# Patient Record
Sex: Male | Born: 1965
Health system: Southern US, Community
[De-identification: ages and names within clinical notes are randomized; demographics above are authoritative.]

## PROBLEM LIST (undated history)

## (undated) ENCOUNTER — Emergency Department (HOSPITAL_BASED_OUTPATIENT_CLINIC_OR_DEPARTMENT_OTHER): Payer: 59

## (undated) DIAGNOSIS — D649 Anemia, unspecified: Secondary | ICD-10-CM

## (undated) DIAGNOSIS — R112 Nausea with vomiting, unspecified: Secondary | ICD-10-CM

## (undated) DIAGNOSIS — Z87442 Personal history of urinary calculi: Secondary | ICD-10-CM

## (undated) DIAGNOSIS — M1990 Unspecified osteoarthritis, unspecified site: Secondary | ICD-10-CM

## (undated) DIAGNOSIS — M129 Arthropathy, unspecified: Secondary | ICD-10-CM

## (undated) DIAGNOSIS — Z9889 Other specified postprocedural states: Secondary | ICD-10-CM

## (undated) DIAGNOSIS — K759 Inflammatory liver disease, unspecified: Secondary | ICD-10-CM

## (undated) HISTORY — DX: Arthropathy, unspecified: M12.9

## (undated) HISTORY — PX: JOINT REPLACEMENT: SHX530

---
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
## 1990-10-11 HISTORY — PX: SHOULDER ARTHROSCOPY W/ ROTATOR CUFF REPAIR: SHX2400

## 2005-07-14 ENCOUNTER — Emergency Department (HOSPITAL_COMMUNITY): Admission: EM | Admit: 2005-07-14 | Discharge: 2005-07-14 | Payer: Self-pay | Admitting: Emergency Medicine

## 2008-06-07 ENCOUNTER — Ambulatory Visit: Payer: Self-pay | Admitting: Sports Medicine

## 2008-06-07 DIAGNOSIS — M179 Osteoarthritis of knee, unspecified: Secondary | ICD-10-CM | POA: Insufficient documentation

## 2008-06-07 DIAGNOSIS — M1711 Unilateral primary osteoarthritis, right knee: Secondary | ICD-10-CM | POA: Insufficient documentation

## 2008-06-07 DIAGNOSIS — M1611 Unilateral primary osteoarthritis, right hip: Secondary | ICD-10-CM

## 2008-06-07 DIAGNOSIS — M129 Arthropathy, unspecified: Secondary | ICD-10-CM

## 2008-06-10 ENCOUNTER — Encounter: Payer: Self-pay | Admitting: Sports Medicine

## 2008-10-16 ENCOUNTER — Ambulatory Visit: Payer: Self-pay | Admitting: Diagnostic Radiology

## 2008-10-16 ENCOUNTER — Emergency Department (HOSPITAL_BASED_OUTPATIENT_CLINIC_OR_DEPARTMENT_OTHER): Admission: EM | Admit: 2008-10-16 | Discharge: 2008-10-16 | Payer: Self-pay | Admitting: Emergency Medicine

## 2009-02-20 ENCOUNTER — Encounter: Admission: RE | Admit: 2009-02-20 | Discharge: 2009-02-20 | Payer: Self-pay | Admitting: Family Medicine

## 2009-07-12 ENCOUNTER — Ambulatory Visit: Payer: Self-pay | Admitting: Diagnostic Radiology

## 2009-07-12 ENCOUNTER — Emergency Department (HOSPITAL_BASED_OUTPATIENT_CLINIC_OR_DEPARTMENT_OTHER): Admission: EM | Admit: 2009-07-12 | Discharge: 2009-07-12 | Payer: Self-pay | Admitting: Emergency Medicine

## 2009-11-07 ENCOUNTER — Encounter: Admission: RE | Admit: 2009-11-07 | Discharge: 2009-11-07 | Payer: Self-pay | Admitting: Orthopedic Surgery

## 2010-04-09 ENCOUNTER — Ambulatory Visit: Payer: Self-pay | Admitting: Sports Medicine

## 2010-04-09 DIAGNOSIS — M76899 Other specified enthesopathies of unspecified lower limb, excluding foot: Secondary | ICD-10-CM

## 2010-04-14 ENCOUNTER — Encounter: Payer: Self-pay | Admitting: Family Medicine

## 2010-04-29 ENCOUNTER — Encounter: Admission: RE | Admit: 2010-04-29 | Discharge: 2010-06-08 | Payer: Self-pay | Admitting: Family Medicine

## 2010-04-29 ENCOUNTER — Encounter: Payer: Self-pay | Admitting: Sports Medicine

## 2010-06-11 ENCOUNTER — Ambulatory Visit: Payer: Self-pay | Admitting: Sports Medicine

## 2010-07-13 ENCOUNTER — Emergency Department (HOSPITAL_COMMUNITY): Admission: EM | Admit: 2010-07-13 | Discharge: 2010-07-13 | Payer: Self-pay | Admitting: Family Medicine

## 2010-07-13 ENCOUNTER — Telehealth (INDEPENDENT_AMBULATORY_CARE_PROVIDER_SITE_OTHER): Payer: Self-pay | Admitting: *Deleted

## 2010-11-10 NOTE — Letter (Signed)
Summary: *Referral Letter  [facility]  [address]   [place], [place] [address]   Phone: [phone]  Fax: [phone]    06/11/2010 Dr. Gean Birchwood [facility]  Dear Homero Fellers:   Thank you in advance for agreeing to see my patient whom you have seen in past for knee arthritis.  [facility] [address] [place], [place]  [address]  Phone: [phone]  Reason for Referral: I think he needs RT hip replacement and at some point RT knee. Your opinion.  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  Sincerely,  Vincent Gros MD

## 2010-11-10 NOTE — Assessment & Plan Note (Signed)
Summary: F/U HIP,MC   Vital Signs:  Patient profile:   45 year old male BP sitting:   114 / 78  Vitals Entered By: Enid Baas MD (June 11, 2010 11:40 AM)  History of Present Illness: 45 yo WM with hx severe right knee and hip arthritis and hx of ACL injury, now here for f/u of right hip pain. PT has helped ROM but not pain. He has tried Mobic and Etodolac in apst with little benefit. He likes to play basketball when he has time. He still has pain voer the greater trochaneter and glut mm.  injection on last visit did help for 1 mo  Allergies: No Known Drug Allergies  Review of Systems General:  Denies chills, fatigue, fever, loss of appetite, malaise, sleep disorder, sweats, weakness, and weight loss. MS:  Complains of joint pain and stiffness; denies joint redness, joint swelling, loss of strength, low back pain, mid back pain, muscle aches, muscle , cramps, muscle weakness, and thoracic pain. Neuro:  Denies numbness and tingling.  Physical Exam  General:  Well-developed,well-nourished,in no acute distress; alert,appropriate and cooperative throughout examination Msk:  Hip: ROM RT Hip IR: 0 Deg, ER: 30 Deg, Flexion: 120 Deg, Extension: 100 Deg, Abduction: 45 Deg, Adduction: 45 Deg Strength IR: 5/5, ER: 5/5, Flexion: 5/5, Extension: 5/5, Abduction: 5/5, Adduction: 5/5 Pelvic alignment unremarkable to inspection and palpation. Standing hip rotation and gait without trendelenburg / unsteadiness. Greater trochanter with (+) tenderness to palpation. No tenderness over piriformis or left greater trochanter. No SI joint tenderness and normal minimal SI movement. (+) trendelenberg with ambulation  Neurologic:  No cranial nerve deficits noted. Station and gait are normal. Plantar reflexes are down-going bilaterally. DTRs are symmetrical throughout. Sensory, motor and coordinative functions appear intact.   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 1-2 tabs q 6 hours as needed Prescriptions: TRAMADOL HCL 50 MG  TABS (TRAMADOL HCL) 1-2 tabs q 6 hours as needed  #120 x 2   Entered and Authorized by:   Enid Baas MD   Signed by:   Enid Baas MD on 06/11/2010   Method used:   Electronically to        [facility] [phone]* (retail)       [address]       [place], [place]  [address]       Ph: [phone]       Fax: [phone]   RxID:   [id number]

## 2010-11-10 NOTE — Miscellaneous (Signed)
Summary: [facility]  [facility]   Imported By: Marily Memos 04/30/2010 13:44:18  _____________________________________________________________________  External Attachment:    Type:   Image     Comment:   External Document

## 2010-11-10 NOTE — Letter (Signed)
Summary: [facility] PT referral form  [facility] PT referral form   Imported By: Marily Memos 04/21/2010 11:41:58  _____________________________________________________________________  External Attachment:    Type:   Image     Comment:   External Document

## 2010-11-10 NOTE — Letter (Signed)
Summary: [facility] PT referral form  [facility] PT referral form   Imported By: Marily Memos 04/21/2010 11:19:03  _____________________________________________________________________  External Attachment:    Type:   Image     Comment:   External Document

## 2010-11-10 NOTE — Assessment & Plan Note (Signed)
Summary: HIP PAIN,MC   Vital Signs:  Patient profile:   45 year old male Height:      70 inches Weight:      195 pounds BMI:     28.08 BP sitting:   121 / 84  Vitals Entered By: Lillia Pauls CMA (April 09, 2010 1:37 PM)  History of Present Illness: Pt presents for right lateral hip pain that has been getting progressively worse for the past year. He has a known history of moderate to severe arthritis of his right hip. He has noticed that he has less motion in his right hip in addition to severe lateral pain that is worse with going up and down stairs as well as with lying on his right hip at night. This pain will wake him up from sleep. He also has pain in his right buttocks in right groin region that is worse with bending forward to put his shoes and socks on. He can no longer cross his right leg over his left leg. The pain is now constant and became worse when he tried to ride a bicycle with his son last week. He could not pedal at all with his right hip because of its stiffness and because of pain. He takes aspirin but this does not relieve the lateral right hip pain. He has tried etodolac which used to help but is no longer as helpful. He is limping more often these days. His mother has a history of OA in her hands. He takes Tramadol as needed.   Allergies (verified): No Known Drug Allergies  Past History:  Social History: Last updated: 04/09/2010 Likes to play basketball Married  Past Medical History: Arthritis of his right hip and knee  Social History: Likes to play basketball Married  Physical Exam  General:  alert and well-developed.   Head:  normocephalic and atraumatic.   Eyes:  vision grossly intact.   Ears:  Normal hearing Mouth:  MMM Neck:  supple and full ROM.   Lungs:  normal respiratory effort.   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 Patient understood that this is likley due to his severe right hip arthritis and that this may return until his hip pain improves 2. Consented for an injection into his trochanteric bursa on the right Consent obtained and verified. Sterile betadine prep. Furthur cleansed with alcohol. Topical analgesic spray: Ethyl chloride. Joint: Right Trochanteric Bursa Approached in typical fashion with: Completed without difficulty Meds: 40mg  of Kenalog, 9 cc of 1% Lidocaine Needle: Spinal Aftercare instructions and Red flags advised. No heavy lifting for 3 days.   3. Will refer for physical therapy to see if he can improve his range of motion or at least hip strength 4. Follow-up in 4-5 weeks  Orders: Joint Aspirate / Injection, Large (20610) Kenalog 10mg  (4units) (M5784) Physical Therapy Referral (PT)  Problem # 2:  ARTHRITIS, RIGHT HIP (ICD-716.95) Assessment: Deteriorated  1. Could consider and intra-articular injection into his hip for  pain reliev 2. Will likely need a hip replacement in the near future since his quality of life has deteriorated recently  Orders: Physical Therapy Referral (PT)  Problem # 3:  ARTHRITIS, KNEES, BILATERAL (ICD-716.98) Assessment: Improved Much improved per the patient's report  Complete Medication List: 1)  Tramadol Hcl 50 Mg Tabs (Tramadol hcl) .Marland Kitchen.. 1-2 tabs q 6 hours as needed

## 2010-11-10 NOTE — Progress Notes (Signed)
  Phone Note Call from Patient   Caller: Patient Call For: Dr. Darrick Penna Summary of Call: Recvd call from pt's wife- she states that patient took tramadol last night at 1030pm and this morning was having shortness of breath, nausea and chest soreness.  Only symptom that persists at this time is nausea. Dr Darrick Penna then spoke with patient on the phone, and states that he had experienced similar symptoms but not as severe over the past week after taking the med on 2 occassions.  Dr. Darrick Penna advised patient to stop the tramadol, and try ibuprofen, aleve, of tylenol for symptom relief. Patient has not heard from Dr. Wadie Lessen office about referral.  Called Dr. Wadie Lessen office and spoke with Victorino Dike- she states they recvd the referral but had not been able to reach patient to make appt. Gave her the number that patient called from this morning.  Asked her to call back if she is still unable to reach patient.  Also left patient a VM asking him to call back if he does not hear from their office.  Initial call taken by: Rochele Pages RN,  July 13, 2010 11:17 AM

## 2011-01-25 LAB — BASIC METABOLIC PANEL
BUN: 28 mg/dL — ABNORMAL HIGH (ref 6–23)
CO2: 30 mEq/L (ref 19–32)
Calcium: 9.5 mg/dL (ref 8.4–10.5)
Chloride: 102 mEq/L (ref 96–112)
Creatinine, Ser: 1.2 mg/dL (ref 0.4–1.5)
GFR calc Af Amer: >60 mL/min (ref >60)
GFR calc non Af Amer: >60 mL/min (ref >60)
Glucose, Bld: 144 mg/dL — ABNORMAL HIGH (ref 70–99)
Potassium: 4 mEq/L (ref 3.5–5.1)
Sodium: 141 mEq/L (ref 135–145)

## 2011-01-25 LAB — CBC
HCT: 41 % (ref 39.0–52.0)
Hemoglobin: 14 g/dL (ref 13.0–17.0)
MCHC: 34.2 g/dL (ref 30.0–36.0)
MCV: 87.2 fL (ref 78.0–100.0)
Platelets: 160 10*3/uL (ref 150–400)
RBC: 4.7 MIL/uL (ref 4.22–5.81)
RDW: 12.9 % (ref 11.5–15.5)
WBC: 8.2 10*3/uL (ref 4.0–10.5)

## 2011-01-25 LAB — URINALYSIS, ROUTINE W REFLEX MICROSCOPIC
Glucose, UA: NEGATIVE mg/dL
Ketones, ur: 15 mg/dL — AB
Leukocytes, UA: NEGATIVE
Nitrite: NEGATIVE
Protein, ur: 30 mg/dL — AB
Specific Gravity, Urine: 1.03 (ref 1.005–1.030)
Urobilinogen, UA: 1 mg/dL (ref 0.0–1.0)
pH: 5 (ref 5.0–8.0)

## 2011-01-25 LAB — URINE MICROSCOPIC-ADD ON

## 2011-01-25 LAB — DIFFERENTIAL
Basophils Absolute: 0 10*3/uL (ref 0.0–0.1)
Basophils Relative: 0 % (ref 0–1)
Eosinophils Absolute: 0 10*3/uL (ref 0.0–0.7)
Eosinophils Relative: 1 % (ref 0–5)
Lymphocytes Relative: 8 % — ABNORMAL LOW (ref 12–46)
Lymphs Abs: 0.6 10*3/uL — ABNORMAL LOW (ref 0.7–4.0)
Monocytes Absolute: 0.4 10*3/uL (ref 0.1–1.0)
Monocytes Relative: 5 % (ref 3–12)
Neutro Abs: 7.2 10*3/uL (ref 1.7–7.7)
Neutrophils Relative %: 86 % — ABNORMAL HIGH (ref 43–77)

## 2011-09-10 ENCOUNTER — Ambulatory Visit (INDEPENDENT_AMBULATORY_CARE_PROVIDER_SITE_OTHER): Payer: 59 | Admitting: Family Medicine

## 2011-09-10 ENCOUNTER — Encounter: Payer: Self-pay | Admitting: Family Medicine

## 2011-09-10 VITALS — BP 107/74 | HR 72 | Temp 98.1°F | Ht 70.0 in | Wt 205.0 lb

## 2011-09-10 DIAGNOSIS — M25551 Pain in right hip: Secondary | ICD-10-CM

## 2011-09-10 DIAGNOSIS — M76899 Other specified enthesopathies of unspecified lower limb, excluding foot: Secondary | ICD-10-CM

## 2011-09-10 DIAGNOSIS — M25559 Pain in unspecified hip: Secondary | ICD-10-CM

## 2011-09-10 MED ORDER — DICLOFENAC SODIUM 50 MG PO TBEC: 50.0000 mg | DELAYED_RELEASE_TABLET | Freq: Two times a day (BID) | ORAL | Status: DC

## 2011-09-10 NOTE — Patient Instructions (Signed)
You have trochanteric bursitis. Ice the area 3-4 times a day 15 minutes at a time Try diclofenac twice a day with food for pain and inflammation for next 2 weeks - if this doesn't help you more than the nabumetone, stop it and go back to that medicine. You were given a cortisone injection for bursitis. Avoid painful activities when possible Avoid lying on affected side if this is painful. Continue home stretches and exercises most days of the week that you learned in physical therapy. Follow up with [place] as needed.

## 2011-09-12 ENCOUNTER — Encounter: Payer: Self-pay | Admitting: Family Medicine

## 2011-09-12 NOTE — Assessment & Plan Note (Addendum)
While he does have severe OA confirmed by limited ROM of hip, most of his current pain is consistent with trochanteric bursitis.  Went ahead with a bursal injection today which he tolerated well.  Continue with home exercises and stretches he learned in PT.  Mild lumbar radiculopathy could be another consideration if he's not improving and pain is not localized to his groin - he did not get benefit with an intraarticular injection a year ago.  Will try diclofenac instead of nabumetone.  See instructions for further.  After informed written consent (risks bleeding, infection, sciatic nerve injury), patient was lying on left side on exam table.  Area overlying right trochanteric bursa was prepped with alcohol swab.  Then bursa was injected with 6:2 marcaine:depomedrol.  He tolerated the procedure well without any immediate complications.

## 2011-09-12 NOTE — Progress Notes (Signed)
Subjective:    Patient ID: Bruce Hart, male    DOB: March 18, 1966, 45 y.o.   MRN: [id number]  PCP: Dr. Sandi Mealy  HPI 45 yo M here for right hip pain.  Patient has had > 3 years of right hip pain. Has known mod-severe right hip arthritis as well as trochanteric bursitis. Last seen about a year ago for these issues. Takes nabumetone which he reports does help. Has had trochanteric bursa injections on right side which help for several months and he would like one again today. Had an intraarticular injection of right hip with helped maybe briefly but didn't last more than the day he had the injection and was very painful. No prior hip injuries and no new injuries. Occasional numbness/tingling into his knees. Bruise sensation of his buttock in addition to pain on lateral aspect of hip. Mild groin pain but majority is lateral. Able to sleep on this side.  Past Medical History  Diagnosis Date  . Arthritis, multiple joint involvement     No current outpatient prescriptions on file prior to visit.    Past Surgical History  Procedure Date  . Shoulder surgery     1992 left shoulder scope    No Known Allergies  History   Social History  . Marital Status: Married    Spouse Name: N/A    Number of Children: N/A  . Years of Education: N/A   Occupational History  . Not on file.   Social History Main Topics  . Smoking status: Never Smoker   . Smokeless tobacco: Not on file  . Alcohol Use: Not on file  . Drug Use: Not on file  . Sexually Active: Not on file   Other Topics Concern  . Not on file   Social History Narrative  . No narrative on file    Family History  Problem Relation Age of Onset  . Hyperlipidemia Mother   . Hypertension Mother   . Sudden death Father   . Diabetes Father   . Hyperlipidemia Father   . Hypertension Father     BP 107/74  Pulse 72  Temp(Src) 98.1 F (36.7 C) (Oral)  Ht 5\' 10"  (1.778 m)  Wt 205 lb (92.987 kg)  BMI 29.41 kg/m2  Review of  Systems See HPI    Objective:   Physical Exam Back: No gross deformity, scoliosis. Mod TTP right greater trochanter, mild just posterior to this .  No midline or bony TTP. FROM back without pain on flexion or extension. Strength LEs 5/5 all muscle groups including hip abduction. 2+ MSRs in patellar and achilles tendons, equal bilaterally. Negative SLRs. Sensation intact to light touch bilaterally. Negative fabers and piriformis stretches - difficult to adequately perform with limited hip ROM.  R hip: Minimal internal rotation consistent with severe OA. Mild pain on logroll and IR but mostly lateral hip, not in groin.     Assessment & Plan:  1. Right hip pain - While he does have severe OA confirmed by limited ROM of hip, most of his current pain is consistent with trochanteric bursitis.  Went ahead with a bursal injection today which he tolerated well.  Continue with home exercises and stretches he learned in PT.  Mild lumbar radiculopathy could be another consideration if he's not improving and pain is not localized to his groin - he did not get benefit with an intraarticular injection a year ago.  Will try diclofenac instead of nabumetone.  See instructions for further.  After informed written  consent (risks bleeding, infection, sciatic nerve injury), patient was lying on left side on exam table.  Area overlying right trochanteric bursa was prepped with alcohol swab.  Then bursa was injected with 6:2 marcaine:depomedrol.  He tolerated the procedure well without any immediate complications.

## 2012-07-10 ENCOUNTER — Other Ambulatory Visit: Payer: Self-pay | Admitting: Family Medicine

## 2012-08-09 ENCOUNTER — Ambulatory Visit: Payer: 59 | Admitting: Family Medicine

## 2012-08-15 ENCOUNTER — Ambulatory Visit (HOSPITAL_BASED_OUTPATIENT_CLINIC_OR_DEPARTMENT_OTHER)
Admission: RE | Admit: 2012-08-15 | Discharge: 2012-08-15 | Disposition: A | Discharge status: Home / Self Care | Payer: 59 | Source: Ambulatory Visit | Attending: Family Medicine | Admitting: Family Medicine

## 2012-08-15 ENCOUNTER — Ambulatory Visit (INDEPENDENT_AMBULATORY_CARE_PROVIDER_SITE_OTHER): Payer: 59 | Admitting: Family Medicine

## 2012-08-15 ENCOUNTER — Encounter: Payer: Self-pay | Admitting: Family Medicine

## 2012-08-15 VITALS — BP 121/75 | Ht 70.0 in | Wt 165.0 lb

## 2012-08-15 DIAGNOSIS — M25559 Pain in unspecified hip: Secondary | ICD-10-CM

## 2012-08-15 DIAGNOSIS — R222 Localized swelling, mass and lump, trunk: Secondary | ICD-10-CM

## 2012-08-15 DIAGNOSIS — M25551 Pain in right hip: Secondary | ICD-10-CM

## 2012-08-15 MED ORDER — DICLOFENAC SODIUM 50 MG PO TBEC: 50.0000 mg | DELAYED_RELEASE_TABLET | Freq: Two times a day (BID) | ORAL | Status: AC

## 2012-08-15 NOTE — Assessment & Plan Note (Signed)
Unusual in that he has not had prior injury, not noticed this before.  Malignancy would be unusual in this area.  Would expect this to be a subluxed sternocostal joint but again not noticed prior injury.  CXR performed today negative for abnormality but difficult to assess.  Will move forward with MRI without contrast to further assess.

## 2012-08-15 NOTE — Progress Notes (Addendum)
Subjective:    Patient ID: Bruce Hart, male    DOB: [birth year], 46 y.o.   MRN: [id number]  PCP: Dr. Sandi Mealy  Hip Pain    46 yo M here for f/u right hip pain.  09/10/11: Patient has had > 3 years of right hip pain. Has known mod-severe right hip arthritis as well as trochanteric bursitis. Last seen about a year ago for these issues. Takes nabumetone which he reports does help. Has had trochanteric bursa injections on right side which help for several months and he would like one again today. Had an intraarticular injection of right hip with helped maybe briefly but didn't last more than the day he had the injection and was very painful. No prior hip injuries and no new injuries. Occasional numbness/tingling into his knees. Bruise sensation of his buttock in addition to pain on lateral aspect of hip. Mild groin pain but majority is lateral. Able to sleep on this side.  08/15/12: Patient reports trochanteric injection last visit helped for several months. Doing home exercises/stretches when he can as well. Has been icing. Over past few weeks has become bad again - mostly lateral hip, sometimes in right groin where he has fairly severe arthritis (injection here previously did not help). Voltaren orally has helped. Rarely radiates from lateral hip. No numbness/tingling or bowel/bladder dysfunction.  He also noted (and wife has noticed) he's developed a mass left side of chest wall that he did not notice previously. Feels hard, not mobile. No injury to this area that he is aware of. May have come up in past few months. No pain associated with this. Did not report any sweats, chills, prior h/o cancer either.  Past Medical History  Diagnosis Date  . Arthritis, multiple joint involvement     No current outpatient prescriptions on file prior to visit.    Past Surgical History  Procedure Date  . Shoulder surgery     1992 left shoulder scope    No Known Allergies  History    Social History  . Marital Status: Married    Spouse Name: N/A    Number of Children: N/A  . Years of Education: N/A   Occupational History  . Not on file.   Social History Main Topics  . Smoking status: Never Smoker   . Smokeless tobacco: Not on file  . Alcohol Use: Not on file  . Drug Use: Not on file  . Sexually Active: Not on file   Other Topics Concern  . Not on file   Social History Narrative  . No narrative on file    Family History  Problem Relation Age of Onset  . Hyperlipidemia Mother   . Hypertension Mother   . Sudden death Father   . Diabetes Father   . Hyperlipidemia Father   . Hypertension Father     BP 121/75  Ht 5\' 10"  (1.778 m)  Wt 165 lb (74.844 kg)  BMI 23.68 kg/m2  Review of Systems  See HPI    Objective:   Physical Exam  Back: No gross deformity, scoliosis. Mod TTP right greater trochanter, mild just posterior to this reproducing his pain.  No midline or bony TTP. FROM back without pain on flexion or extension. Strength LEs 5/5 all muscle groups including hip abduction.  Mild pain with hip abduction. Negative SLRs. Sensation intact to light touch bilaterally.  R hip: Minimal internal rotation consistent with severe OA. No pain on logroll and IR.  Chest: Lower left sternocostal border  palpable bony protrusion at joint. Nonmobile mass here seems congruent with rib.   No tenderness to palpation.       Assessment & Plan:  1. Right hip pain - While he does have severe OA confirmed by limited ROM of hip, most of his current pain is consistent with trochanteric bursitis.  Prior bursal injection provided relief also consistent with this diagnosis.  Repeat trochanteric bursa injection given today.  Handout provided with stretches.  Consider formal PT (has done this already though).  Refilled his diclofenac twice a day as needed.  After informed written consent (risks bleeding, infection, sciatic nerve injury), patient was lying on left  side on exam table.  Area overlying right trochanteric bursa was prepped with alcohol swab.  Then bursa was injected with 6:2 marcaine:depomedrol.  He tolerated the procedure well without any immediate complications.  2. Chest mass - Unusual in that he has not had prior injury, not noticed this before.  Malignancy would be unusual in this area.  Would expect this to be a subluxed sternocostal joint but again not noticed prior injury.  CXR performed today negative for abnormality but difficult to assess.  Will move forward with MRI without contrast to further assess.  MRI results reviewed and discussed with patient - no evidence of mass, joint dislocation or subluxation in area of concern.  Reassured patient.

## 2012-08-15 NOTE — Assessment & Plan Note (Signed)
While he does have severe OA confirmed by limited ROM of hip, most of his current pain is consistent with trochanteric bursitis.  Prior bursal injection provided relief also consistent with this diagnosis.  Repeat trochanteric bursa injection given today.  Handout provided with stretches.  Consider formal PT (has done this already though).  Refilled his diclofenac twice a day as needed.  After informed written consent (risks bleeding, infection, sciatic nerve injury), patient was lying on left side on exam table.  Area overlying right trochanteric bursa was prepped with alcohol swab.  Then bursa was injected with 6:2 marcaine:depomedrol.  He tolerated the procedure well without any immediate complications.

## 2012-08-19 ENCOUNTER — Other Ambulatory Visit (HOSPITAL_BASED_OUTPATIENT_CLINIC_OR_DEPARTMENT_OTHER): Payer: 59

## 2012-08-19 ENCOUNTER — Ambulatory Visit (HOSPITAL_BASED_OUTPATIENT_CLINIC_OR_DEPARTMENT_OTHER)
Admission: RE | Admit: 2012-08-19 | Discharge: 2012-08-19 | Disposition: A | Discharge status: Home / Self Care | Payer: 59 | Source: Ambulatory Visit | Attending: Family Medicine | Admitting: Family Medicine

## 2012-08-19 DIAGNOSIS — R222 Localized swelling, mass and lump, trunk: Secondary | ICD-10-CM | POA: Insufficient documentation

## 2013-07-18 ENCOUNTER — Ambulatory Visit: Payer: 59 | Admitting: Family Medicine

## 2013-07-19 ENCOUNTER — Encounter: Payer: Self-pay | Admitting: Family Medicine

## 2013-07-19 ENCOUNTER — Ambulatory Visit (INDEPENDENT_AMBULATORY_CARE_PROVIDER_SITE_OTHER): Payer: 59 | Admitting: Family Medicine

## 2013-07-19 VITALS — BP 111/71 | HR 64 | Ht 70.0 in | Wt 171.0 lb

## 2013-07-19 DIAGNOSIS — S93499A Sprain of other ligament of unspecified ankle, initial encounter: Secondary | ICD-10-CM

## 2013-07-19 DIAGNOSIS — M25519 Pain in unspecified shoulder: Secondary | ICD-10-CM

## 2013-07-19 DIAGNOSIS — S86011A Strain of right Achilles tendon, initial encounter: Secondary | ICD-10-CM

## 2013-07-19 DIAGNOSIS — M25511 Pain in right shoulder: Secondary | ICD-10-CM

## 2013-07-19 NOTE — Patient Instructions (Signed)
You suffered a shoulder separation (AC sprain), likely grade 2. Ice the area 3-4 times a day for 15 minutes at a time as needed. Tylenol or aleve as needed for pain These usually resolve in 2-4 weeks.  No additional therapy needs to be done.  You strained your right achilles but did not rupture this. Ice 15 minutes at a time (ok to use heat instead after 72 hours). Heel lifts when up and walking around usually for 4-6 weeks. When tolerated (pain 1-2/10 with this) start calf raise exercise 3 sets of 10 on both feet. Advance to 1 footed raise when tolerated. Compression with a sleeve or ace wrap though may be difficult this low on the calf. Consider formal physical therapy. You have to be able to do the exercise without pain before returning to running and cutting. When you do, try jogging first for a couple days then advance to cutting as you would when playing basketball. Expect 1-2 weeks for you to complete this progression. Call me with any questions or concerns.

## 2013-07-23 ENCOUNTER — Encounter: Payer: Self-pay | Admitting: Family Medicine

## 2013-07-23 DIAGNOSIS — M6520 Calcific tendinitis, unspecified site: Secondary | ICD-10-CM | POA: Insufficient documentation

## 2013-07-23 DIAGNOSIS — S86011A Strain of right Achilles tendon, initial encounter: Secondary | ICD-10-CM | POA: Insufficient documentation

## 2013-07-23 NOTE — Progress Notes (Signed)
Patient ID: Bruce Hart, male   DOB: April 05, 1966, 47 y.o.   MRN: [id number]  PCP: Ancil Boozer, MD  Subjective:   HPI: Patient is a 47 y.o. male here for right leg, shoulder pain.  Patient reports on Tuesday night he went up for a rebound playing basketball, came down and felt some pain in right achilles area. No bruising or swelling. Stopped playing because he didn't want to hurt anything further. Has been icing, taking meloxicam. Pain worse with stairs.  Reports also 2 weeks ago falling directly onto right shoulder. Pain lateral clavicle, superior shoulder. Has improved since but still with some pain. Maybe some swelling initially but none now and no bruising.  Past Medical History  Diagnosis Date  . Arthritis, multiple joint involvement     Current Outpatient Prescriptions on File Prior to Visit  Medication Sig Dispense Refill  . diclofenac (VOLTAREN) 50 MG EC tablet Take 1 tablet (50 mg total) by mouth 2 (two) times daily.  60 tablet  2   No current facility-administered medications on file prior to visit.    Past Surgical History  Procedure Laterality Date  . Shoulder surgery      1992 left shoulder scope    No Known Allergies  History   Social History  . Marital Status: Married    Spouse Name: N/A    Number of Children: N/A  . Years of Education: N/A   Occupational History  . Not on file.   Social History Main Topics  . Smoking status: Never Smoker   . Smokeless tobacco: Not on file  . Alcohol Use: Not on file  . Drug Use: Not on file  . Sexual Activity: Not on file   Other Topics Concern  . Not on file   Social History Narrative  . No narrative on file    Family History  Problem Relation Age of Onset  . Hyperlipidemia Mother   . Hypertension Mother   . Sudden death Father   . Diabetes Father   . Hyperlipidemia Father   . Hypertension Father     BP 111/71  Pulse 64  Ht 5\' 10"  (1.778 m)  Wt 171 lb (77.565 kg)  BMI 24.54  kg/m2  Review of Systems: See HPI above.    Objective:  Physical Exam:  Gen: NAD  Right ankle/foot: No gross deformity, swelling, ecchymoses, defect in achilles. FROM ankle with pain on calf raise but able to do so. TTP at musculotendinous junction of achilles.  No other TTP. Negative ant drawer and talar tilt.   Negative syndesmotic compression. Thompsons test negative. NV intact distally.  R shoulder: No swelling, ecchymoses.  No gross deformity. TTP AC joint and distal clavicle. FROM. Negative Hawkins, Neers. Negative Speeds, Yergasons. Strength 5/5 with empty can and resisted internal/external rotation. Negative apprehension. + crossover adduction. NV intact distally.    Assessment & Plan:  1. Right shoulder AC sprain - likely grade 2.  Doubt clavicle fracture given extent to which he has improved over just 2 weeks, also no deformity.  Offered radiographs but he declined at this time.  Tylenol, aleve, icing.  2. Right achilles strain - thompsons negative, no defect within the tendon.  Able to do calf raise, all positive signs.  Heel lifts, icing, tylenol/nsaids.  When tolerated start calf raise exercise as directed.  Compression.  Consider formal PT.  Discussed return to sports protocol.

## 2013-07-23 NOTE — Assessment & Plan Note (Signed)
thompsons negative, no defect within the tendon.  Able to do calf raise, all positive signs.  Heel lifts, icing, tylenol/nsaids.  When tolerated start calf raise exercise as directed.  Compression.  Consider formal PT.  Discussed return to sports protocol.

## 2013-07-23 NOTE — Assessment & Plan Note (Signed)
Right shoulder AC sprain - likely grade 2.  Doubt clavicle fracture given extent to which he has improved over just 2 weeks, also no deformity.  Offered radiographs but he declined at this time.  Tylenol, aleve, icing.

## 2013-11-14 ENCOUNTER — Other Ambulatory Visit: Payer: Self-pay | Admitting: Family Medicine

## 2013-11-14 ENCOUNTER — Other Ambulatory Visit: Payer: Self-pay | Admitting: *Deleted

## 2013-11-14 MED ORDER — DICLOFENAC SODIUM 50 MG PO TBEC: 50.0000 mg | DELAYED_RELEASE_TABLET | Freq: Two times a day (BID) | ORAL | Status: DC

## 2014-04-19 ENCOUNTER — Other Ambulatory Visit: Payer: Self-pay | Admitting: Family Medicine

## 2014-04-19 DIAGNOSIS — D179 Benign lipomatous neoplasm, unspecified: Secondary | ICD-10-CM

## 2014-04-29 ENCOUNTER — Ambulatory Visit
Admission: RE | Admit: 2014-04-29 | Discharge: 2014-04-29 | Disposition: A | Discharge status: Home / Self Care | Payer: 59 | Source: Ambulatory Visit | Attending: Family Medicine | Admitting: Family Medicine

## 2014-04-29 DIAGNOSIS — D179 Benign lipomatous neoplasm, unspecified: Secondary | ICD-10-CM

## 2014-11-25 ENCOUNTER — Encounter: Payer: Self-pay | Admitting: Family Medicine

## 2014-11-25 ENCOUNTER — Ambulatory Visit: Payer: Self-pay | Admitting: Sports Medicine

## 2014-11-25 ENCOUNTER — Ambulatory Visit (INDEPENDENT_AMBULATORY_CARE_PROVIDER_SITE_OTHER): Payer: 59 | Admitting: Family Medicine

## 2014-11-25 VITALS — BP 115/76 | HR 73 | Ht 70.0 in | Wt 194.0 lb

## 2014-11-25 DIAGNOSIS — M25551 Pain in right hip: Secondary | ICD-10-CM

## 2014-11-25 DIAGNOSIS — M76891 Other specified enthesopathies of right lower limb, excluding foot: Secondary | ICD-10-CM

## 2014-11-25 MED ORDER — METHYLPREDNISOLONE ACETATE 40 MG/ML IJ SUSP
40.0000 mg | Freq: Once | INTRAMUSCULAR | Status: AC
  Administered 2014-11-25: 40 mg via INTRA_ARTICULAR

## 2014-11-25 NOTE — Patient Instructions (Signed)
You have trochanteric bursitis. Avoid painful activities as much as possible. Ice over area of pain 3-4 times a day for 15 minutes at a time Hip side raise exercise 3 x 15 once a day - add weights if this becomes too easy. Stretches - pick 2 and hold for 20-30 seconds x 3 - do once or twice a day. Tylenol and/or aleve as needed for pain. You were given a cortisone shot today. If not improving consider physical therapy. Follow up with me in 1 month or as needed.

## 2014-11-27 NOTE — Progress Notes (Signed)
PCP: Patria Mane, MD  Subjective:   HPI: Patient is a 49 y.o. male here for right hip pain.  Patient reports pain has worsened over past several months. Seemed to improve but then worsened again. No known injury. Worse lying on side. Feels similar to when he had bursitis and did well with injection. Pain is constant, 5/10 level. No radiation. No numbness/tingling.  Past Medical History  Diagnosis Date  . Arthritis, multiple joint involvement     Current Outpatient Prescriptions on File Prior to Visit  Medication Sig Dispense Refill  . diclofenac (VOLTAREN) 50 MG EC tablet Take 1 tablet (50 mg total) by mouth 2 (two) times daily. 60 tablet 0  . finasteride (PROSCAR) 5 MG tablet      No current facility-administered medications on file prior to visit.    Past Surgical History  Procedure Laterality Date  . Shoulder surgery      1992 left shoulder scope    Allergies  Allergen Reactions  . Tramadol     History   Social History  . Marital Status: Married    Spouse Name: N/A  . Number of Children: N/A  . Years of Education: N/A   Occupational History  . Not on file.   Social History Main Topics  . Smoking status: Never Smoker   . Smokeless tobacco: Not on file  . Alcohol Use: Not on file  . Drug Use: Not on file  . Sexual Activity: Not on file   Other Topics Concern  . Not on file   Social History Narrative    Family History  Problem Relation Age of Onset  . Hyperlipidemia Mother   . Hypertension Mother   . Sudden death Father   . Diabetes Father   . Hyperlipidemia Father   . Hypertension Father     BP 115/76 mmHg  Pulse 73  Ht 5\' 10"  (1.778 m)  Wt 194 lb (87.998 kg)  BMI 27.84 kg/m2  Review of Systems: See HPI above.    Objective:  Physical Exam:  Gen: NAD  Back/R hip: No gross deformity, scoliosis. TTP right greater trochanter.  No midline or bony TTP. FROM. Strength LEs 5/5 all muscle groups except 5-/5 with right hip  abduction.   2+ MSRs in patellar and achilles tendons, equal bilaterally. Negative SLRs. Sensation intact to light touch bilaterally. Negative logroll bilateral hips Negative fabers and piriformis stretches.    Assessment & Plan:  1. Right trochanteric bursitis - reviewed home exercises and stretches to do daily.  Injection given as well.  Tylenol/aleve as needed icing as needed.  F/u in 1 month or prn.  Consider repeat injection, physical therapy if not improving.  After informed written consent patient was lying on left side on exam table.  Area overlying right trochanteric bursa prepped with alcohol swab then injected with 6:2 marcaine: depomedrol.  Patient tolerated procedure well without immediate complications.

## 2014-11-27 NOTE — Assessment & Plan Note (Signed)
reviewed home exercises and stretches to do daily.  Injection given as well.  Tylenol/aleve as needed icing as needed.  F/u in 1 month or prn.  Consider repeat injection, physical therapy if not improving.  After informed written consent patient was lying on left side on exam table.  Area overlying right trochanteric bursa prepped with alcohol swab then injected with 6:2 marcaine: depomedrol.  Patient tolerated procedure well without immediate complications.

## 2014-12-09 ENCOUNTER — Telehealth: Payer: Self-pay | Admitting: Family Medicine

## 2014-12-09 MED ORDER — DICLOFENAC SODIUM 50 MG PO TBEC: 50.0000 mg | DELAYED_RELEASE_TABLET | Freq: Two times a day (BID) | ORAL | Status: DC

## 2014-12-09 NOTE — Telephone Encounter (Signed)
[place] - I sent it in with a couple refills.  Thanks!

## 2014-12-09 NOTE — Telephone Encounter (Signed)
Spoke to patient and told him that his medication has been sent in to his pharmacy

## 2015-05-05 ENCOUNTER — Encounter: Payer: Self-pay | Admitting: Family Medicine

## 2015-05-05 ENCOUNTER — Ambulatory Visit (INDEPENDENT_AMBULATORY_CARE_PROVIDER_SITE_OTHER): Payer: 59 | Admitting: Family Medicine

## 2015-05-05 VITALS — BP 125/77 | HR 68 | Ht 70.0 in | Wt 195.0 lb

## 2015-05-05 DIAGNOSIS — M25551 Pain in right hip: Secondary | ICD-10-CM | POA: Diagnosis not present

## 2015-05-05 MED ORDER — METHYLPREDNISOLONE ACETATE 40 MG/ML IJ SUSP
40.0000 mg | Freq: Once | INTRAMUSCULAR | Status: AC
  Administered 2015-05-05: 40 mg via INTRA_ARTICULAR

## 2015-05-07 NOTE — Assessment & Plan Note (Signed)
Right trochanteric bursitis - reviewed home exercises and stretches to do daily.  Injection given again today as well.  Tylenol/aleve as needed icing as needed.  F/u prn.  Discussed his DJD as well as a contributing factor though not primary problem.    After informed written consent patient was lying on left side on exam table.  Area overlying right trochanteric bursa prepped with alcohol swab then injected with 6:2 marcaine: depomedrol.  Patient tolerated procedure well without immediate complications.

## 2015-05-07 NOTE — Progress Notes (Signed)
PCP: Patria Mane, MD  Subjective:   HPI: Patient is a 49 y.o. male here for right hip pain.  2/15: Patient reports pain has worsened over past several months. Seemed to improve but then worsened again. No known injury. Worse lying on side. Feels similar to when he had bursitis and did well with injection. Pain is constant, 5/10 level. No radiation. No numbness/tingling.  7/25: Patient reports over past 4 weeks his right pain has returned lateral right hip. No new injuries. Feels like he's getting some intraarticular pain as well - had intraarticular injection previously - did not help and was extremely painful. Worse lying on side. Pain 6/10 level. No radiation, numbness/tingling.  Past Medical History  Diagnosis Date  . Arthritis, multiple joint involvement     Current Outpatient Prescriptions on File Prior to Visit  Medication Sig Dispense Refill  . diclofenac (VOLTAREN) 50 MG EC tablet Take 1 tablet (50 mg total) by mouth 2 (two) times daily. 60 tablet 2  . finasteride (PROSCAR) 5 MG tablet      No current facility-administered medications on file prior to visit.    Past Surgical History  Procedure Laterality Date  . Shoulder surgery      1992 left shoulder scope    Allergies  Allergen Reactions  . Tramadol     History   Social History  . Marital Status: Married    Spouse Name: N/A  . Number of Children: N/A  . Years of Education: N/A   Occupational History  . Not on file.   Social History Main Topics  . Smoking status: Never Smoker   . Smokeless tobacco: Not on file  . Alcohol Use: Not on file  . Drug Use: Not on file  . Sexual Activity: Not on file   Other Topics Concern  . Not on file   Social History Narrative    Family History  Problem Relation Age of Onset  . Hyperlipidemia Mother   . Hypertension Mother   . Sudden death Father   . Diabetes Father   . Hyperlipidemia Father   . Hypertension Father     BP 125/77 mmHg  Pulse  68  Ht 5\' 10"  (1.778 m)  Wt 195 lb (88.451 kg)  BMI 27.98 kg/m2  Review of Systems: See HPI above.    Objective:  Physical Exam:  Gen: NAD  Back/R hip: No gross deformity, scoliosis. TTP right greater trochanter.  No midline or bony TTP. FROM back but hip with very limited motion. Strength LEs 5/5 all muscle groups except 5-/5 with right hip abduction.   2+ MSRs in patellar and achilles tendons, equal bilaterally. Negative SLRs. Sensation intact to light touch bilaterally. Mildly positive right logroll, negative left. Negative fabers and piriformis stretches.    Assessment & Plan:  1. Right trochanteric bursitis - reviewed home exercises and stretches to do daily.  Injection given again today as well.  Tylenol/aleve as needed icing as needed.  F/u prn.  Discussed his DJD as well as a contributing factor though not primary problem.    After informed written consent patient was lying on left side on exam table.  Area overlying right trochanteric bursa prepped with alcohol swab then injected with 6:2 marcaine: depomedrol.  Patient tolerated procedure well without immediate complications.

## 2015-09-26 ENCOUNTER — Ambulatory Visit (HOSPITAL_BASED_OUTPATIENT_CLINIC_OR_DEPARTMENT_OTHER)
Admission: RE | Admit: 2015-09-26 | Discharge: 2015-09-26 | Disposition: A | Discharge status: Home / Self Care | Payer: 59 | Source: Ambulatory Visit | Attending: Family Medicine | Admitting: Family Medicine

## 2015-09-26 ENCOUNTER — Ambulatory Visit (INDEPENDENT_AMBULATORY_CARE_PROVIDER_SITE_OTHER): Payer: 59 | Admitting: Family Medicine

## 2015-09-26 ENCOUNTER — Encounter: Payer: Self-pay | Admitting: Family Medicine

## 2015-09-26 VITALS — BP 104/66 | HR 76 | Ht 70.0 in | Wt 205.0 lb

## 2015-09-26 DIAGNOSIS — W19XXXA Unspecified fall, initial encounter: Secondary | ICD-10-CM | POA: Diagnosis not present

## 2015-09-26 DIAGNOSIS — M25531 Pain in right wrist: Secondary | ICD-10-CM | POA: Diagnosis not present

## 2015-09-26 DIAGNOSIS — S6991XA Unspecified injury of right wrist, hand and finger(s), initial encounter: Secondary | ICD-10-CM

## 2015-09-26 NOTE — Patient Instructions (Signed)
Your x-rays are reassuring. You have a distal radius contusion. Wrist brace to help rest this as often as possible. Icing 15 minutes at a time 3-4 times a day. Ibuprofen 600mg  three times a day with food OR aleve 2 tabs twice a day with food as needed for pain and inflammation. Follow up with me in 6 weeks if still struggling. Would consider an MRI, physical therapy if this is the case.

## 2015-09-29 DIAGNOSIS — S6991XA Unspecified injury of right wrist, hand and finger(s), initial encounter: Secondary | ICD-10-CM | POA: Insufficient documentation

## 2015-09-29 NOTE — Assessment & Plan Note (Signed)
independently reviewed radiographs and no evidence of fracture, other bony abnormalities.  Brief bedside u/s shows no evidence of a flexor tendon tear, disruption of radial artery, or cortical irregularity.  Reassured patient this is a radial contusion.  Wrist brace, icing, nsaids.  F/u in 6 weeks.  Consider MRI if still not improving as expected by that point.

## 2015-09-29 NOTE — Progress Notes (Signed)
PCP: Patria Mane, MD  Subjective:   HPI: Patient is a 49 y.o. male here for right wrist injury.  Patient reports 1 month ago he fell and tripped, sustained [place] injury to right wrist. Had some pain especially the day after that has persisted to now. Pain level 5/10, sharp. Worse with any motions of the wrist, turning and opening things. Pain mainly volar area. No swelling or bruising. No skin changes, fever, other complaints.  Past Medical History  Diagnosis Date  . Arthritis, multiple joint involvement     Current Outpatient Prescriptions on File Prior to Visit  Medication Sig Dispense Refill  . diclofenac (VOLTAREN) 50 MG EC tablet Take 1 tablet (50 mg total) by mouth 2 (two) times daily. 60 tablet 2  . finasteride (PROSCAR) 5 MG tablet      No current facility-administered medications on file prior to visit.    Past Surgical History  Procedure Laterality Date  . Shoulder surgery      1992 left shoulder scope    Allergies  Allergen Reactions  . Tramadol     Social History   Social History  . Marital Status: Married    Spouse Name: N/A  . Number of Children: N/A  . Years of Education: N/A   Occupational History  . Not on file.   Social History Main Topics  . Smoking status: Never Smoker   . Smokeless tobacco: Not on file  . Alcohol Use: Not on file  . Drug Use: Not on file  . Sexual Activity: Not on file   Other Topics Concern  . Not on file   Social History Narrative    Family History  Problem Relation Age of Onset  . Hyperlipidemia Mother   . Hypertension Mother   . Sudden death Father   . Diabetes Father   . Hyperlipidemia Father   . Hypertension Father     BP 104/66 mmHg  Pulse 76  Ht 5\' 10"  (1.778 m)  Wt 205 lb (92.987 kg)  BMI 29.41 kg/m2  Review of Systems: See HPI above.    Objective:  Physical Exam:  Gen: NAD  Right wrist: No gross deformity, swelling, bruising. TTP distal radius volar side.  No other tenderness.    FROM wrist with mild pain on flexion and radial deviation. Strength 5/5 all directions and with digits. Negative tinels, phalens, finkelsteins. Sensation intact to light touch.  Left wrist: FROM without pain.    Assessment & Plan:  1. Right wrist injury - independently reviewed radiographs and no evidence of fracture, other bony abnormalities.  Brief bedside u/s shows no evidence of a flexor tendon tear, disruption of radial artery, or cortical irregularity.  Reassured patient this is a radial contusion.  Wrist brace, icing, nsaids.  F/u in 6 weeks.  Consider MRI if still not improving as expected by that point.

## 2016-08-17 ENCOUNTER — Ambulatory Visit
Admission: RE | Admit: 2016-08-17 | Discharge: 2016-08-17 | Disposition: A | Discharge status: Home / Self Care | Payer: 59 | Source: Ambulatory Visit | Attending: Family Medicine | Admitting: Family Medicine

## 2016-08-17 ENCOUNTER — Other Ambulatory Visit: Payer: Self-pay | Admitting: Family Medicine

## 2016-08-17 DIAGNOSIS — Z01818 Encounter for other preprocedural examination: Secondary | ICD-10-CM

## 2016-11-04 DIAGNOSIS — R6889 Other general symptoms and signs: Secondary | ICD-10-CM | POA: Diagnosis not present

## 2016-11-04 MED FILL — OSELTAMIVIR PHOS 75 MG CAP: 75 | 5 days supply | Qty: 10 | Fill #0

## 2016-11-04 MED FILL — BROMFED-DM COUGH SYRUP: 30-2-10 | 5 days supply | Qty: 180 | Fill #0

## 2016-12-06 DIAGNOSIS — H43811 Vitreous degeneration, right eye: Secondary | ICD-10-CM | POA: Diagnosis not present

## 2017-03-24 DIAGNOSIS — R079 Chest pain, unspecified: Secondary | ICD-10-CM | POA: Diagnosis not present

## 2017-03-24 DIAGNOSIS — R471 Dysarthria and anarthria: Secondary | ICD-10-CM | POA: Diagnosis not present

## 2017-03-24 DIAGNOSIS — R42 Dizziness and giddiness: Secondary | ICD-10-CM | POA: Diagnosis not present

## 2017-03-25 ENCOUNTER — Encounter: Payer: Self-pay | Admitting: Neurology

## 2017-03-25 ENCOUNTER — Telehealth: Payer: Self-pay

## 2017-03-25 NOTE — Telephone Encounter (Signed)
SENT NOTES TO SCHEDULING 

## 2017-03-31 ENCOUNTER — Telehealth: Payer: Self-pay | Admitting: Internal Medicine

## 2017-03-31 NOTE — Telephone Encounter (Signed)
Received records from [place] for appointment on 04/14/17 with Dr Debara Pickett.  Records put with Dr Lysbeth Penner schedule for 04/14/17. lp

## 2017-04-04 ENCOUNTER — Other Ambulatory Visit: Payer: Self-pay | Admitting: Family Medicine

## 2017-04-04 DIAGNOSIS — R471 Dysarthria and anarthria: Secondary | ICD-10-CM

## 2017-04-06 ENCOUNTER — Encounter: Payer: Self-pay | Admitting: Cardiovascular Disease

## 2017-04-06 ENCOUNTER — Ambulatory Visit (INDEPENDENT_AMBULATORY_CARE_PROVIDER_SITE_OTHER): Payer: 59 | Admitting: Cardiovascular Disease

## 2017-04-06 DIAGNOSIS — E7800 Pure hypercholesterolemia, unspecified: Secondary | ICD-10-CM

## 2017-04-06 DIAGNOSIS — R0789 Other chest pain: Secondary | ICD-10-CM | POA: Diagnosis not present

## 2017-04-06 DIAGNOSIS — E785 Hyperlipidemia, unspecified: Secondary | ICD-10-CM | POA: Insufficient documentation

## 2017-04-06 NOTE — Assessment & Plan Note (Signed)
History of hyperlipidemia with lipid profile performed 06/24/16 revealed total cholesterol 244, LDL of 158. He is not on statin therapy.This was followed by his PCP.

## 2017-04-06 NOTE — Patient Instructions (Signed)
Medication Instructions: Your physician recommends that you continue on your current medications as directed. Please refer to the Current Medication list given to you today.   Testing/Procedures: Your physician has requested that you have an exercise tolerance test. For further information please visit [email or web address]. Please also follow instruction sheet, as given.    Follow-Up: ,Your physician recommends that you schedule a follow-up appointment in: 3 months with Dr. Gwenlyn Found.

## 2017-04-06 NOTE — Assessment & Plan Note (Signed)
Mr. Bruce Hart was referred by Dr. Orland Mustard for atypical chest pain. His cardiac risk factor profile is notable for untreated hyperlipidemia and family history with a father who died of a myocardial infarction at age 52. He has never had a heart attack or stroke. He had an episode of chest pain which began on 03/17/17 while he was en route to [place]. He was on [place] at the airport and developed left inframammary mammary chest pain radiating to his left arm which was exacerbated by left arm movement. The pain otherwise does not radiate. There are no other associated symptoms. He has had some other aches and pains with myalgias and arthralgias. I do not think this is cardiovascular in nature. I am going to get a routine GXT to further evaluate.

## 2017-04-06 NOTE — Progress Notes (Signed)
04/06/2017 Bruce Hart   18-Jan-1966  [id number]  Primary Physician Lujean Amel, MD Primary Cardiologist: Lorretta Harp MD Renae Gloss  HPI:   Mr Bruce Hart Is a 51 year old Moderately overweight Married male father of 2, grandfather one grandchild is accompanied by his wife [place]. He was referred by Dr. Orland Mustard for atypical chest pain. His cardiac risk factor profile is notable for untreated hyperlipidemia. He does have a family history of heart disease with a father who died of a myocardial infarction age 30. He has never had a heart attack or stroke. He is not had previous chest pain before 03/17/17 when he was en route to [place] and stopped at [place]. He was wearing a carry on his left shoulder and was under a lot of stress. He developed some left precordial chest pain which got worse with arm movement. Since he's been home this is occurred on and off with other constitutional symptoms including myalgias and arthralgias.   Current Outpatient Prescriptions  Medication Sig Dispense Refill  . diclofenac (VOLTAREN) 50 MG EC tablet Take 1 tablet (50 mg total) by mouth 2 (two) times daily. 60 tablet 2  . finasteride (PROSCAR) 5 MG tablet     . ibuprofen (ADVIL,MOTRIN) 200 MG tablet Take 1 tablet by mouth as directed.     No current facility-administered medications for this visit.     Allergies  Allergen Reactions  . Tramadol     Social History   Social History  . Marital status: Married    Spouse name: N/A  . Number of children: N/A  . Years of education: N/A   Occupational History  . Not on file.   Social History Main Topics  . Smoking status: Never Smoker  . Smokeless tobacco: Never Used  . Alcohol use Not on file  . Drug use: Unknown  . Sexual activity: Not on file   Other Topics Concern  . Not on file   Social History Narrative  . No narrative on file     Review of Systems: General: negative for chills, fever, night sweats or weight  changes.  Cardiovascular: negative for chest pain, dyspnea on exertion, edema, orthopnea, palpitations, paroxysmal nocturnal dyspnea or shortness of breath Dermatological: negative for rash Respiratory: negative for cough or wheezing Urologic: negative for hematuria Abdominal: negative for nausea, vomiting, diarrhea, bright red blood per rectum, melena, or hematemesis Neurologic: negative for visual changes, syncope, or dizziness All other systems reviewed and are otherwise negative except as noted above.    Blood pressure 114/82, pulse 69, height 5\' 10"  (1.778 m), weight 225 lb 3.2 oz (102.2 kg).  General appearance: alert and no distress Neck: no adenopathy, no carotid bruit, no JVD, supple, symmetrical, trachea midline and thyroid not enlarged, symmetric, no tenderness/mass/nodules Lungs: clear to auscultation bilaterally Heart: regular rate and rhythm, S1, S2 normal, no murmur, click, rub or gallop Extremities: extremities normal, atraumatic, no cyanosis or edema  EKG Sinus rhythm of 69 without ST or T-wave changes. I personally reviewed this EKG.  ASSESSMENT AND PLAN:   Hyperlipidemia History of hyperlipidemia with lipid profile performed 06/24/16 revealed total cholesterol 244, LDL of 158. He is not on statin therapy.This was followed by his PCP.  Atypical chest pain Mr. Bruce Hart was referred by Dr. Orland Mustard for atypical chest pain. His cardiac risk factor profile is notable for untreated hyperlipidemia and family history with a father who died of a myocardial infarction at age 34. He has never had  a heart attack or stroke. He had an episode of chest pain which began on 03/17/17 while he was en route to [place]. He was on [place] at the airport and developed left inframammary mammary chest pain radiating to his left arm which was exacerbated by left arm movement. The pain otherwise does not radiate. There are no other associated symptoms. He has had some other aches and pains with  myalgias and arthralgias. I do not think this is cardiovascular in nature. I am going to get a routine GXT to further evaluate.      Lorretta Harp MD FACP,FACC,FAHA, [facility] 04/06/2017 4:51 PM

## 2017-04-07 ENCOUNTER — Encounter: Payer: Self-pay | Admitting: Cardiovascular Disease

## 2017-04-07 DIAGNOSIS — R509 Fever, unspecified: Secondary | ICD-10-CM | POA: Diagnosis not present

## 2017-04-07 DIAGNOSIS — M2550 Pain in unspecified joint: Secondary | ICD-10-CM | POA: Diagnosis not present

## 2017-04-07 DIAGNOSIS — M791 Myalgia: Secondary | ICD-10-CM | POA: Diagnosis not present

## 2017-04-11 NOTE — Addendum Note (Signed)
Addended by: Zebedee Iba on: 04/11/2017 08:37 AM   Modules accepted: Orders

## 2017-04-14 ENCOUNTER — Ambulatory Visit: Payer: 59 | Admitting: Internal Medicine

## 2017-04-15 DIAGNOSIS — R748 Abnormal levels of other serum enzymes: Secondary | ICD-10-CM | POA: Diagnosis not present

## 2017-04-15 DIAGNOSIS — D729 Disorder of white blood cells, unspecified: Secondary | ICD-10-CM | POA: Diagnosis not present

## 2017-04-21 ENCOUNTER — Telehealth (HOSPITAL_COMMUNITY): Payer: Self-pay

## 2017-04-21 NOTE — Telephone Encounter (Signed)
Encounter complete. 

## 2017-04-22 ENCOUNTER — Ambulatory Visit
Admission: RE | Admit: 2017-04-22 | Discharge: 2017-04-22 | Disposition: A | Discharge status: Home / Self Care | Payer: 59 | Source: Ambulatory Visit | Attending: Family Medicine | Admitting: Family Medicine

## 2017-04-22 DIAGNOSIS — R471 Dysarthria and anarthria: Secondary | ICD-10-CM

## 2017-04-22 DIAGNOSIS — R55 Syncope and collapse: Secondary | ICD-10-CM | POA: Diagnosis not present

## 2017-04-22 MED ORDER — GADOBENATE DIMEGLUMINE 529 MG/ML IV SOLN
20.0000 mL | Freq: Once | INTRAVENOUS | Status: AC | PRN
  Administered 2017-04-22: 20 mL via INTRAVENOUS

## 2017-04-23 DIAGNOSIS — M25562 Pain in left knee: Secondary | ICD-10-CM | POA: Diagnosis not present

## 2017-04-26 ENCOUNTER — Ambulatory Visit (HOSPITAL_COMMUNITY)
Admission: RE | Admit: 2017-04-26 | Discharge: 2017-04-26 | Disposition: A | Discharge status: Home / Self Care | Payer: 59 | Source: Ambulatory Visit | Attending: Cardiovascular Disease | Admitting: Cardiovascular Disease

## 2017-04-26 DIAGNOSIS — R0789 Other chest pain: Secondary | ICD-10-CM | POA: Diagnosis not present

## 2017-04-26 LAB — EXERCISE TOLERANCE TEST
Estimated workload: 10.1 METS
Exercise duration (min): 9 min
Exercise duration (sec): 0 s
MPHR: 168 {beats}/min
Peak HR: 179 {beats}/min
Percent HR: 106 %
RPE: 18
Rest HR: 62 {beats}/min

## 2017-05-08 IMAGING — CR DG CHEST 2V
2 series · 2 of 2 positions shown · non-contrast
Comparison: 08/15/2012 .

CLINICAL DATA: Hip surgery.

EXAM:
CHEST  2 VIEW

[w chest pa]
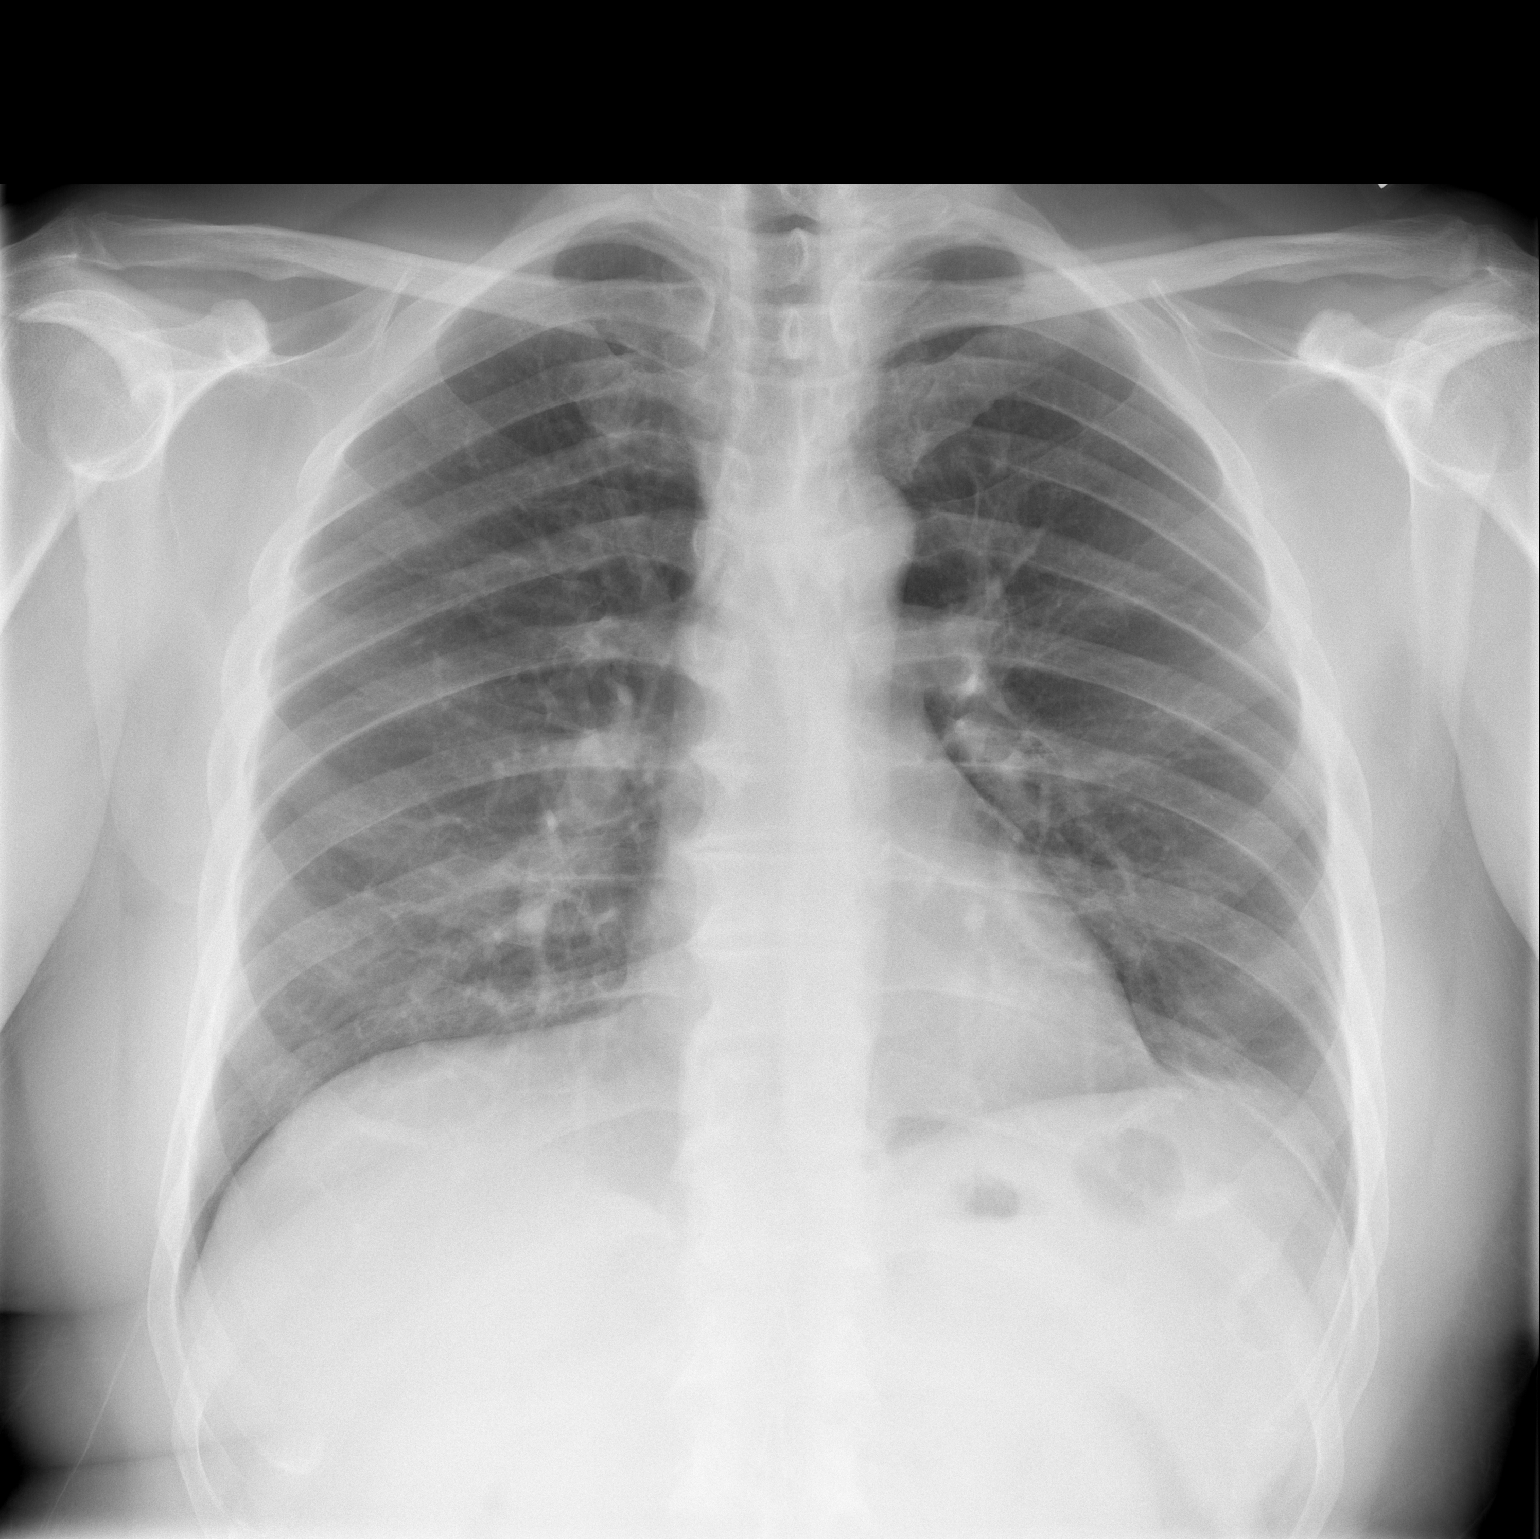

[w chest lat]
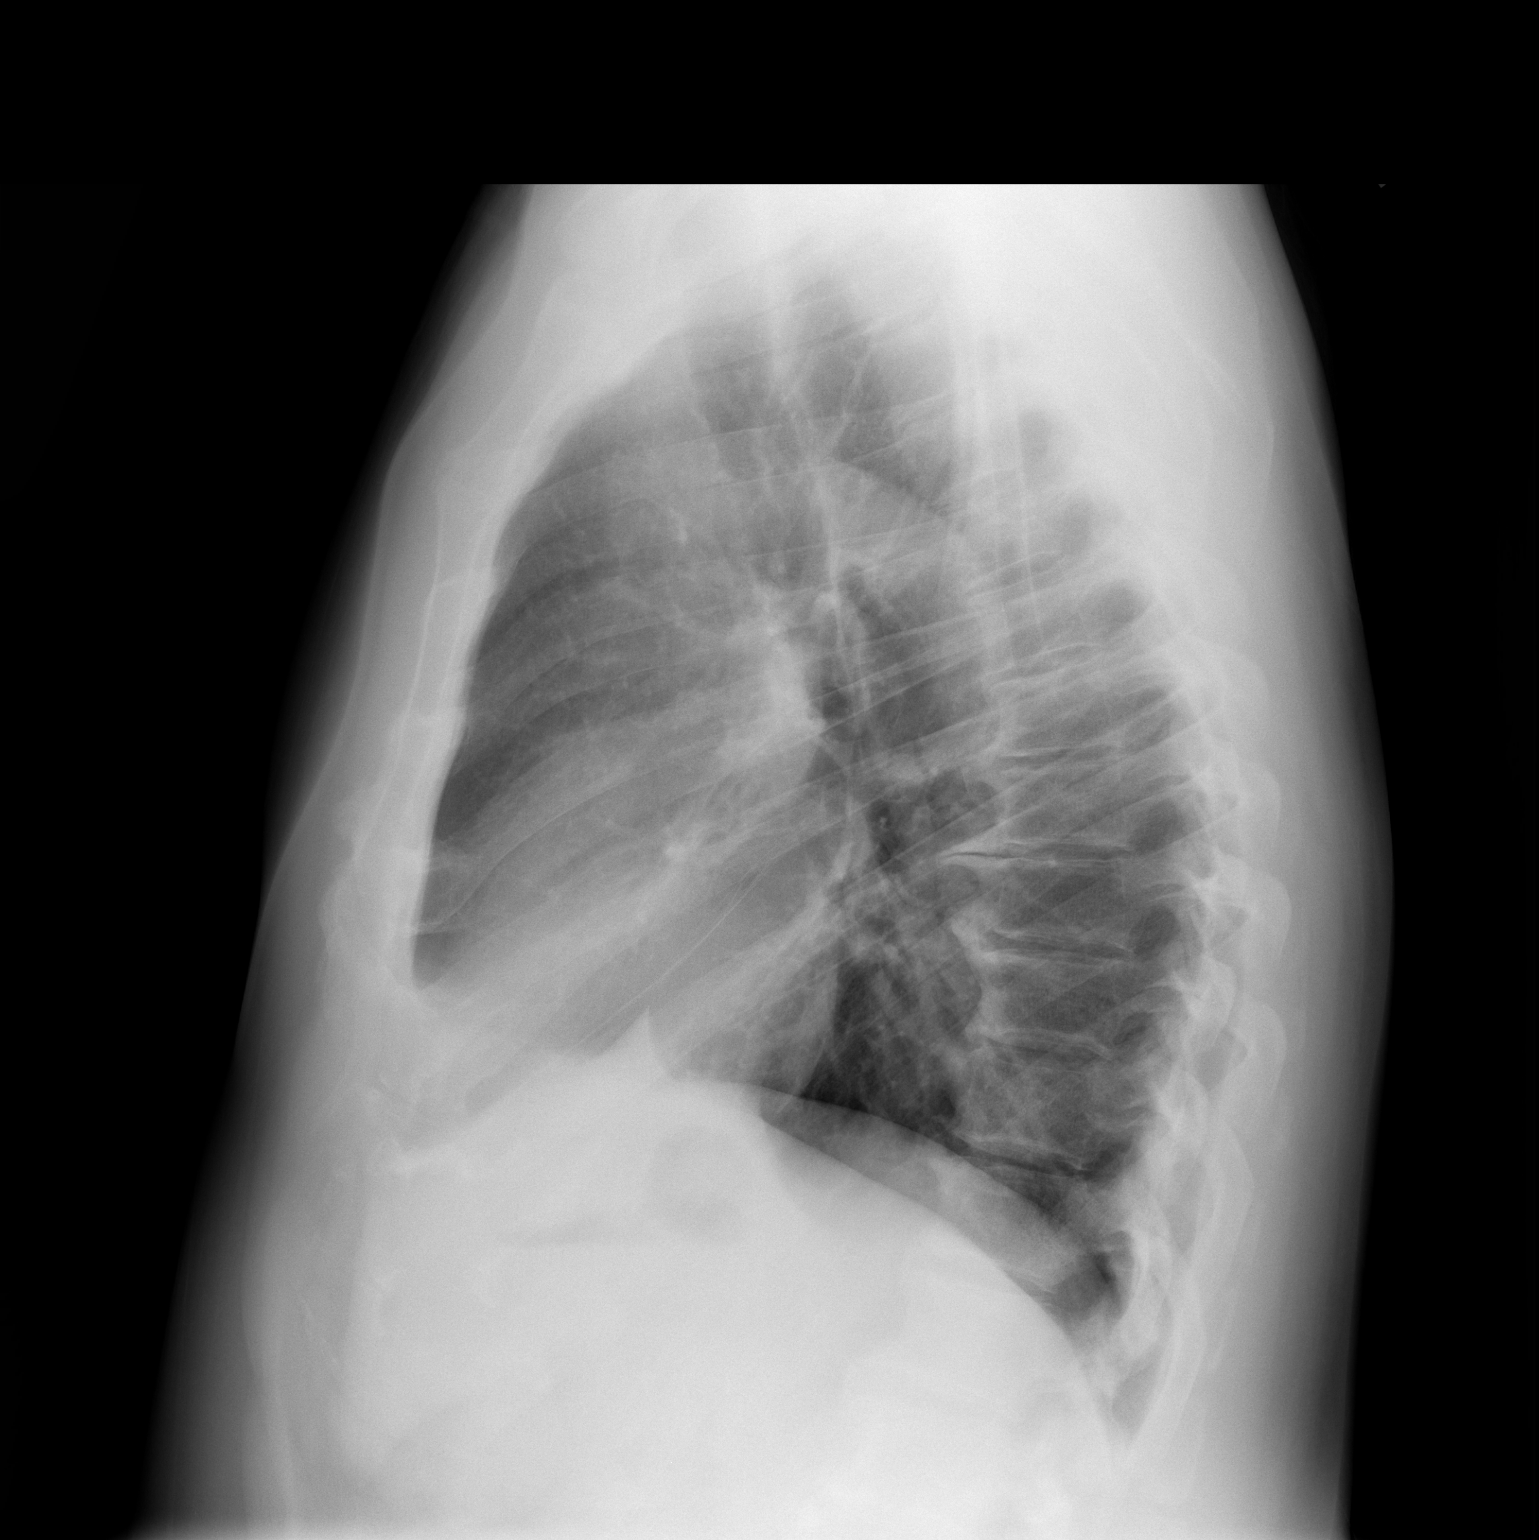

[2 of 2 positions shown; findings below may reference images not displayed]

FINDINGS: Mediastinum hilar structures are normal. Mild left base subsegmental
atelectasis. No pleural effusion or pneumothorax. No acute bony
abnormality. Degenerative changes thoracic spine .
IMPRESSION: Mild left base subsegmental atelectasis.

## 2017-05-12 ENCOUNTER — Other Ambulatory Visit: Payer: Self-pay | Admitting: Family Medicine

## 2017-05-12 ENCOUNTER — Encounter: Payer: Self-pay | Admitting: Family Medicine

## 2017-05-12 ENCOUNTER — Ambulatory Visit (INDEPENDENT_AMBULATORY_CARE_PROVIDER_SITE_OTHER): Payer: 59 | Admitting: Family Medicine

## 2017-05-12 DIAGNOSIS — M25562 Pain in left knee: Secondary | ICD-10-CM

## 2017-05-12 NOTE — Assessment & Plan Note (Signed)
independently reviewed outside radiographs and no evidence fracture.  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  No issues prior to recent Dengue fever - discussed may have been triggered by this more likely than viral illness infecting the knee.  Aspiration and injection performed today.  Discussed tylenol, aleve, topical medicines, supplements that may help.  Icing, home exercises.  F/u in 1 month.  After informed written consent patient was lying supine on exam table.  Left knee was prepped with alcohol swab.  Utilizing superolateral approach, 3 mL of bupivicaine was used for local anesthesia.  Then using an 18g needle on 60cc syringe with ultrasound guidance,14 mL of clear straw-colored fluid was aspirated from left knee.  Knee was then injected with 3:1 bupivicaine:depomedrol.  Patient tolerated procedure well without immediate complications.

## 2017-05-12 NOTE — Progress Notes (Addendum)
PCP: Lujean Amel, MD  Subjective:   HPI: Patient is a 51 y.o. male here for left knee pain.  Patient reports 2 months ago while in [place] he was bit by a mosquito while in the lobby checking out. Over the next week he developed fever to 101.7, diffuse body aches within muscles and joints. This included left knee. Other areas improved but left knee pain has persisted and been swollen. He has had pain at 4/10 but up to 6-7/10 and sharp since then. Worse with walking. Went to an orthopedic urgent care - had x-rays (reviewed today as well) showing severe arthritis. Was given cortisone injection which maybe lasted 2-3 days. He has not been doing anything else for pain. No skin changes, numbness.  Past Medical History:  Diagnosis Date  . Arthritis, multiple joint involvement     Current Outpatient Prescriptions on File Prior to Visit  Medication Sig Dispense Refill  . diclofenac (VOLTAREN) 50 MG EC tablet Take 1 tablet (50 mg total) by mouth 2 (two) times daily. 60 tablet 2  . finasteride (PROSCAR) 5 MG tablet     . ibuprofen (ADVIL,MOTRIN) 200 MG tablet Take 1 tablet by mouth as directed.     No current facility-administered medications on file prior to visit.     Past Surgical History:  Procedure Laterality Date  . SHOULDER SURGERY     1992 left shoulder scope    Allergies  Allergen Reactions  . Tramadol     Social History   Social History  . Marital status: Married    Spouse name: N/A  . Number of children: N/A  . Years of education: N/A   Occupational History  . Not on file.   Social History Main Topics  . Smoking status: Never Smoker  . Smokeless tobacco: Never Used  . Alcohol use Not on file  . Drug use: Unknown  . Sexual activity: Not on file   Other Topics Concern  . Not on file   Social History Narrative  . No narrative on file    Family History  Problem Relation Age of Onset  . Hyperlipidemia Mother   . Hypertension Mother   . Sudden  death Father   . Diabetes Father   . Hyperlipidemia Father   . Hypertension Father     BP 121/86   Pulse 78   Ht 5\' 10"  (1.778 m)   Wt 225 lb (102.1 kg)   BMI 32.28 kg/m   Review of Systems: See HPI above.     Objective:  Physical Exam:  Gen: NAD, comfortable in exam room  Left knee: No gross deformity, ecchymoses.  Mild effusion. TTP medial joint line, post patellar facets. ROM 0 - 120 degrees. Negative ant/post drawers. Negative valgus/varus testing. Negative lachmanns. Mild pain medially with mcmurrays.  Negative apleys, patellar apprehension. NV intact distally.  Right knee: FROM without pain.   Assessment & Plan:  1. Left knee pain - independently reviewed outside radiographs and no evidence fracture.  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  No issues prior to recent Dengue fever - discussed may have been triggered by this more likely than viral illness infecting the knee.  Aspiration and injection performed today.  Discussed tylenol, aleve, topical medicines, supplements that may help.  Icing, home exercises.  F/u in 1 month.  After informed written consent patient was lying supine on exam table.  Left knee was prepped with alcohol swab.  Utilizing superolateral approach, 3 mL of bupivicaine was  used for local anesthesia.  Then using an 18g needle on 60cc syringe with ultrasound guidance,14 mL of clear straw-colored fluid was aspirated from left knee.  Knee was then injected with 3:1 bupivicaine:depomedrol.  Patient tolerated procedure well without immediate complications.  Addendum:  Spoke with patient - he reports much improved following aspiration and injection.  Noted to have pseudogout crystals on fluid analysis and culture was negative.  Unfortunately lab did not run cell count with diff - notified patient.  I suspect given visual appearance of fluid it was not inflammatory and crystals were incidentally found, pain due to his arthritis.  Will follow  up with [place] if needed.

## 2017-05-12 NOTE — Patient Instructions (Signed)
Your pain is either due to a flare of arthritis (triggered from the Dengue) or related directly to the Dengue - we will send the fluid to the lab to evaluate. These are the different medications you can take for this: Tylenol 500mg  1-2 tabs three times a day for pain. Capsaicin, aspercreme, or biofreeze topically up to four times a day may also help with pain. Some supplements that may help for arthritis: Boswellia extract, curcumin, pycnogenol Aleve 1-2 tabs twice a day with food Cortisone injections are an option - we gave this to you after draining the knee. If cortisone injections do not help, there are different types of shots that may help but they take longer to take effect. It's important that you continue to stay active. Straight leg raises, knee extensions 3 sets of 10 once a day (add ankle weight if these become too easy). Consider physical therapy to strengthen muscles around the joint that hurts to take pressure off of the joint itself. Shoe inserts with good arch support may be helpful. Ice 15 minutes at a time 3-4 times a day as needed to help with pain. Water aerobics and cycling with low resistance are the best two types of exercise for arthritis though any exercise is ok as long as it doesn't worsen the pain. Follow up with me in 1 month but we will contact you next week with the lab results.

## 2017-05-13 DIAGNOSIS — M25562 Pain in left knee: Secondary | ICD-10-CM | POA: Diagnosis not present

## 2017-05-13 LAB — BODY FLUID CRYSTAL

## 2017-05-13 MED ORDER — METHYLPREDNISOLONE ACETATE 40 MG/ML IJ SUSP
40.0000 mg | Freq: Once | INTRAMUSCULAR | Status: AC
  Administered 2017-05-13: 40 mg via INTRA_ARTICULAR

## 2017-05-13 NOTE — Addendum Note (Signed)
Addended by: Sherrie George F on: 05/13/2017 10:01 AM   Modules accepted: Orders

## 2017-05-15 LAB — BODY FLUID CULTURE

## 2017-05-17 ENCOUNTER — Ambulatory Visit: Payer: 59 | Admitting: Neurology

## 2017-05-18 LAB — BODY FLUID CULTURE

## 2017-06-14 ENCOUNTER — Ambulatory Visit: Payer: 59 | Admitting: Family Medicine

## 2017-06-21 ENCOUNTER — Encounter: Payer: Self-pay | Admitting: Family Medicine

## 2017-06-21 ENCOUNTER — Ambulatory Visit (INDEPENDENT_AMBULATORY_CARE_PROVIDER_SITE_OTHER): Payer: 59 | Admitting: Family Medicine

## 2017-06-21 DIAGNOSIS — M25562 Pain in left knee: Secondary | ICD-10-CM

## 2017-06-21 NOTE — Patient Instructions (Signed)
Your knee pain is consistent with moderate-severe arthritis triggered by your Dengue. These are the different medications you can take for this: Tylenol 500mg  1-2 tabs three times a day for pain. Capsaicin, aspercreme, or biofreeze topically up to four times a day may also help with pain. Some supplements that may help for arthritis: Boswellia extract, curcumin, pycnogenol Aleve 1-2 tabs twice a day with food We can repeat cortisone injections every 3 months. We will run you through for the gel shots to see what your coverage would be and let you know. It's important that you continue to stay active. Straight leg raises, knee extensions 3 sets of 10 once a day (add ankle weight if these become too easy). Consider physical therapy to strengthen muscles around the joint that hurts to take pressure off of the joint itself. Shoe inserts with good arch support may be helpful. Ice 15 minutes at a time 3-4 times a day as needed to help with pain. Water aerobics and cycling with low resistance are the best two types of exercise for arthritis though any exercise is ok as long as it doesn't worsen the pain.

## 2017-06-22 NOTE — Progress Notes (Signed)
PCP: Lujean Amel, MD  Subjective:   HPI: Patient is a 51 y.o. male here for left knee pain.  8/2: Patient reports 2 months ago while in [place] he was bit by a mosquito while in the lobby checking out. Over the next week he developed fever to 101.7, diffuse body aches within muscles and joints. This included left knee. Other areas improved but left knee pain has persisted and been swollen. He has had pain at 4/10 but up to 6-7/10 and sharp since then. Worse with walking. Went to an orthopedic urgent care - had x-rays (reviewed today as well) showing severe arthritis. Was given cortisone injection which maybe lasted 2-3 days. He has not been doing anything else for pain. No skin changes, numbness.  9/11: Patient reports he was doing extremely well after last visit. Since then pain has come back and comes and goes. Pain fluctuates from absent up to 4/10 and dull anteriorly. He is taking ibuprofen or naproxen if needed. Gets stiffness some days. Worse in mornings and sometimes can last whole day. No skin changes, numbness.  Past Medical History:  Diagnosis Date  . Arthritis, multiple joint involvement     Current Outpatient Prescriptions on File Prior to Visit  Medication Sig Dispense Refill  . diclofenac (VOLTAREN) 50 MG EC tablet Take 1 tablet (50 mg total) by mouth 2 (two) times daily. 60 tablet 2  . finasteride (PROSCAR) 5 MG tablet     . ibuprofen (ADVIL,MOTRIN) 200 MG tablet Take 1 tablet by mouth as directed.     No current facility-administered medications on file prior to visit.     Past Surgical History:  Procedure Laterality Date  . SHOULDER SURGERY     1992 left shoulder scope    Allergies  Allergen Reactions  . Tramadol     Social History   Social History  . Marital status: Married    Spouse name: N/A  . Number of children: N/A  . Years of education: N/A   Occupational History  . Not on file.   Social History Main Topics  . Smoking status:  Never Smoker  . Smokeless tobacco: Never Used  . Alcohol use Not on file  . Drug use: Unknown  . Sexual activity: Not on file   Other Topics Concern  . Not on file   Social History Narrative  . No narrative on file    Family History  Problem Relation Age of Onset  . Hyperlipidemia Mother   . Hypertension Mother   . Sudden death Father   . Diabetes Father   . Hyperlipidemia Father   . Hypertension Father     BP 118/80   Pulse 76   Ht 5\' 10"  (1.778 m)   Wt 225 lb (102.1 kg)   BMI 32.28 kg/m   Review of Systems: See HPI above.     Objective:  Physical Exam:  Gen: NAD, comfortable in exam room  Left knee: No gross deformity, ecchymoses.  Mild effusion. TTP medial joint line, post patellar facets. ROM 0 - 120 degrees. Negative ant/post drawers. Negative valgus/varus testing. Negative lachmanns. Mild pain medially with mcmurrays.  Negative apleys, patellar apprehension. NV intact distally.  Right knee: FROM without pain.   Assessment & Plan:  1. Left knee pain - Radiographs were negative for fracture but showed severe medial and patellofemoral arthritis.  Benefit with aspiration and injection but still some stiffness and soreness.  Arthritis flared, triggered by recent Dengue fever illness.  We will see  if we can get viscosupplementation approved and go ahead with this.  Also discussed tylenol, aleve, topical medicines, supplements that may help.  Icing, home exercises.

## 2017-06-22 NOTE — Assessment & Plan Note (Signed)
Radiographs were negative for fracture but showed severe medial and patellofemoral arthritis.  Benefit with aspiration and injection but still some stiffness and soreness.  Arthritis flared, triggered by recent Dengue fever illness.  We will see if we can get viscosupplementation approved and go ahead with this.  Also discussed tylenol, aleve, topical medicines, supplements that may help.  Icing, home exercises.

## 2017-07-04 ENCOUNTER — Ambulatory Visit: Payer: 59 | Admitting: Neurology

## 2017-07-08 ENCOUNTER — Ambulatory Visit: Payer: 59 | Admitting: Cardiovascular Disease

## 2017-08-03 DIAGNOSIS — Z01818 Encounter for other preprocedural examination: Secondary | ICD-10-CM | POA: Diagnosis not present

## 2017-08-03 DIAGNOSIS — M1611 Unilateral primary osteoarthritis, right hip: Secondary | ICD-10-CM | POA: Diagnosis not present

## 2017-08-04 ENCOUNTER — Other Ambulatory Visit: Payer: Self-pay | Admitting: Family Medicine

## 2017-08-04 ENCOUNTER — Ambulatory Visit
Admission: RE | Admit: 2017-08-04 | Discharge: 2017-08-04 | Disposition: A | Discharge status: Home / Self Care | Payer: 59 | Source: Ambulatory Visit | Attending: Family Medicine | Admitting: Family Medicine

## 2017-08-04 DIAGNOSIS — Z01818 Encounter for other preprocedural examination: Secondary | ICD-10-CM

## 2017-08-09 DIAGNOSIS — M1611 Unilateral primary osteoarthritis, right hip: Secondary | ICD-10-CM | POA: Diagnosis not present

## 2017-08-12 ENCOUNTER — Ambulatory Visit: Payer: Self-pay | Admitting: Orthopedic Surgery

## 2017-08-19 ENCOUNTER — Ambulatory Visit: Payer: Self-pay | Admitting: Orthopedic Surgery

## 2017-08-19 NOTE — H&P (Signed)
TOTAL HIP ADMISSION H&P  Patient is admitted for right total hip arthroplasty.  Subjective:  Chief Complaint: right hip pain  HPI: Bruce Hart, 51 y.o. male, has a history of pain and functional disability in the right hip(s) due to arthritis and patient has failed non-surgical conservative treatments for greater than 12 weeks to include NSAID's and/or analgesics, flexibility and strengthening excercises, use of assistive devices, weight reduction as appropriate and activity modification.  Onset of symptoms was gradual starting 2 years ago with gradually worsening course since that time.The patient noted no past surgery on the right hip(s).  Patient currently rates pain in the right hip at 10 out of 10 with activity. Patient has night pain, worsening of pain with activity and weight bearing, pain that interfers with activities of daily living, pain with passive range of motion and crepitus. Patient has evidence of subchondral cysts, subchondral sclerosis, periarticular osteophytes and joint space narrowing by imaging studies. This condition presents safety issues increasing the risk of falls.  There is no current active infection.  Patient Active Problem List   Diagnosis Date Noted  . Left knee pain 05/12/2017  . Atypical chest pain 04/06/2017  . Hyperlipidemia 04/06/2017  . Right wrist injury 09/29/2015  . Right shoulder pain 07/23/2013  . Strain of right Achilles tendon 07/23/2013  . Right hip pain 08/15/2012  . Chest wall mass 08/15/2012  . TROCHANTERIC BURSITIS 04/09/2010  . ARTHRITIS, RIGHT HIP 06/07/2008  . ARTHRITIS, KNEES, BILATERAL 06/07/2008   Past Medical History:  Diagnosis Date  . Arthritis, multiple joint involvement     Past Surgical History:  Procedure Laterality Date  . SHOULDER SURGERY     1992 left shoulder scope    Current Outpatient Medications  Medication Sig Dispense Refill Last Dose  . diclofenac (VOLTAREN) 50 MG EC tablet Take 1 tablet (50 mg total) by  mouth 2 (two) times daily. 60 tablet 2 Taking  . finasteride (PROSCAR) 5 MG tablet    Taking  . ibuprofen (ADVIL,MOTRIN) 200 MG tablet Take 1 tablet by mouth as directed.   Taking   No current facility-administered medications for this visit.    Allergies  Allergen Reactions  . Tramadol     Social History   Tobacco Use  . Smoking status: Never Smoker  . Smokeless tobacco: Never Used  Substance Use Topics  . Alcohol use: Not on file    Family History  Problem Relation Age of Onset  . Hyperlipidemia Mother   . Hypertension Mother   . Sudden death Father   . Diabetes Father   . Hyperlipidemia Father   . Hypertension Father      Review of Systems  Constitutional: Negative.   HENT: Negative.   Eyes: Negative.   Respiratory: Negative.   Cardiovascular: Negative.   Gastrointestinal: Negative.   Genitourinary: Negative.   Musculoskeletal: Positive for joint pain.  Skin: Negative.   Neurological: Negative.   Endo/Heme/Allergies: Negative.   Psychiatric/Behavioral: Negative.     Objective:  Physical Exam  Vitals reviewed. Constitutional: He is oriented to person, place, and time. He appears well-developed and well-nourished.  HENT:  Head: Normocephalic and atraumatic.  Eyes: Conjunctivae and EOM are normal. Pupils are equal, round, and reactive to light.  Neck: Normal range of motion. Neck supple.  Cardiovascular: Normal rate, regular rhythm and intact distal pulses.  Respiratory: Effort normal. No respiratory distress.  GI: Soft. He exhibits no distension.  Genitourinary:  Genitourinary Comments: deferred  Neurological: He is alert and oriented to  person, place, and time. He has normal reflexes.  Skin: Skin is warm.  Psychiatric: He has a normal mood and affect. His behavior is normal. Judgment and thought content normal.    Vital signs in last 24 hours: @VSRANGES @  Labs:   Estimated body mass index is 32.28 kg/m as calculated from the following:   Height  as of 06/21/17: 5\' 10"  (1.778 m).   Weight as of 06/21/17: 102.1 kg (225 lb).   Imaging Review Plain radiographs demonstrate severe degenerative joint disease of the right hip(s). The bone quality appears to be adequate for age and reported activity level.  Assessment/Plan:  End stage arthritis, right hip(s)  The patient history, physical examination, clinical judgement of the provider and imaging studies are consistent with end stage degenerative joint disease of the right hip(s) and total hip arthroplasty is deemed medically necessary. The treatment options including medical management, injection therapy, arthroscopy and arthroplasty were discussed at length. The risks and benefits of total hip arthroplasty were presented and reviewed. The risks due to aseptic loosening, infection, stiffness, dislocation/subluxation,  thromboembolic complications and other imponderables were discussed.  The patient acknowledged the explanation, agreed to proceed with the plan and consent was signed. Patient is being admitted for inpatient treatment for surgery, pain control, PT, OT, prophylactic antibiotics, VTE prophylaxis, progressive ambulation and ADL's and discharge planning.The patient is planning to be discharged home with HEP

## 2017-08-19 NOTE — H&P (View-Only) (Signed)
TOTAL HIP ADMISSION H&P  Patient is admitted for right total hip arthroplasty.  Subjective:  Chief Complaint: right hip pain  HPI: Bruce Hart, 51 y.o. male, has a history of pain and functional disability in the right hip(s) due to arthritis and patient has failed non-surgical conservative treatments for greater than 12 weeks to include NSAID's and/or analgesics, flexibility and strengthening excercises, use of assistive devices, weight reduction as appropriate and activity modification.  Onset of symptoms was gradual starting 2 years ago with gradually worsening course since that time.The patient noted no past surgery on the right hip(s).  Patient currently rates pain in the right hip at 10 out of 10 with activity. Patient has night pain, worsening of pain with activity and weight bearing, pain that interfers with activities of daily living, pain with passive range of motion and crepitus. Patient has evidence of subchondral cysts, subchondral sclerosis, periarticular osteophytes and joint space narrowing by imaging studies. This condition presents safety issues increasing the risk of falls.  There is no current active infection.  Patient Active Problem List   Diagnosis Date Noted  . Left knee pain 05/12/2017  . Atypical chest pain 04/06/2017  . Hyperlipidemia 04/06/2017  . Right wrist injury 09/29/2015  . Right shoulder pain 07/23/2013  . Strain of right Achilles tendon 07/23/2013  . Right hip pain 08/15/2012  . Chest wall mass 08/15/2012  . TROCHANTERIC BURSITIS 04/09/2010  . ARTHRITIS, RIGHT HIP 06/07/2008  . ARTHRITIS, KNEES, BILATERAL 06/07/2008   Past Medical History:  Diagnosis Date  . Arthritis, multiple joint involvement     Past Surgical History:  Procedure Laterality Date  . SHOULDER SURGERY     1992 left shoulder scope    Current Outpatient Medications  Medication Sig Dispense Refill Last Dose  . diclofenac (VOLTAREN) 50 MG EC tablet Take 1 tablet (50 mg total) by  mouth 2 (two) times daily. 60 tablet 2 Taking  . finasteride (PROSCAR) 5 MG tablet    Taking  . ibuprofen (ADVIL,MOTRIN) 200 MG tablet Take 1 tablet by mouth as directed.   Taking   No current facility-administered medications for this visit.    Allergies  Allergen Reactions  . Tramadol     Social History   Tobacco Use  . Smoking status: Never Smoker  . Smokeless tobacco: Never Used  Substance Use Topics  . Alcohol use: Not on file    Family History  Problem Relation Age of Onset  . Hyperlipidemia Mother   . Hypertension Mother   . Sudden death Father   . Diabetes Father   . Hyperlipidemia Father   . Hypertension Father      Review of Systems  Constitutional: Negative.   HENT: Negative.   Eyes: Negative.   Respiratory: Negative.   Cardiovascular: Negative.   Gastrointestinal: Negative.   Genitourinary: Negative.   Musculoskeletal: Positive for joint pain.  Skin: Negative.   Neurological: Negative.   Endo/Heme/Allergies: Negative.   Psychiatric/Behavioral: Negative.     Objective:  Physical Exam  Vitals reviewed. Constitutional: He is oriented to person, place, and time. He appears well-developed and well-nourished.  HENT:  Head: Normocephalic and atraumatic.  Eyes: Conjunctivae and EOM are normal. Pupils are equal, round, and reactive to light.  Neck: Normal range of motion. Neck supple.  Cardiovascular: Normal rate, regular rhythm and intact distal pulses.  Respiratory: Effort normal. No respiratory distress.  GI: Soft. He exhibits no distension.  Genitourinary:  Genitourinary Comments: deferred  Neurological: He is alert and oriented to  person, place, and time. He has normal reflexes.  Skin: Skin is warm.  Psychiatric: He has a normal mood and affect. His behavior is normal. Judgment and thought content normal.    Vital signs in last 24 hours: @VSRANGES @  Labs:   Estimated body mass index is 32.28 kg/m as calculated from the following:   Height  as of 06/21/17: 5\' 10"  (1.778 m).   Weight as of 06/21/17: 102.1 kg (225 lb).   Imaging Review Plain radiographs demonstrate severe degenerative joint disease of the right hip(s). The bone quality appears to be adequate for age and reported activity level.  Assessment/Plan:  End stage arthritis, right hip(s)  The patient history, physical examination, clinical judgement of the provider and imaging studies are consistent with end stage degenerative joint disease of the right hip(s) and total hip arthroplasty is deemed medically necessary. The treatment options including medical management, injection therapy, arthroscopy and arthroplasty were discussed at length. The risks and benefits of total hip arthroplasty were presented and reviewed. The risks due to aseptic loosening, infection, stiffness, dislocation/subluxation,  thromboembolic complications and other imponderables were discussed.  The patient acknowledged the explanation, agreed to proceed with the plan and consent was signed. Patient is being admitted for inpatient treatment for surgery, pain control, PT, OT, prophylactic antibiotics, VTE prophylaxis, progressive ambulation and ADL's and discharge planning.The patient is planning to be discharged home with HEP

## 2017-09-08 ENCOUNTER — Encounter (HOSPITAL_COMMUNITY): Payer: Self-pay

## 2017-09-08 NOTE — Pre-Procedure Instructions (Signed)
[facility]  09/08/2017      [place], [place] [address] Phone: [phone] Fax: [phone]  [facility] - [place], [place] - [address] [place] [address] Phone: [phone] Fax: [phone]    Your procedure is scheduled on Dec 3.  Report to [facility] Admitting at 945 A.M.  Call this number if you have problems the morning of surgery:  [phone]   Remember:  Do not eat food or drink liquids after midnight.  Take these medicines the morning of surgery with A SIP OF WATER NONE  Stop aspirin as directed by your Dr. Stop taking BC's, Goody's, Herbal medications, Fish Oil, Ibuprofen, Advil, Motrin, Aleve, Diclofenac (Voltaren), Vitamins   Do not wear jewelry, make-up or nail polish.  Do not wear lotions, powders, or perfumes, or deoderant.  Do not shave 48 hours prior to surgery.  Men may shave face and neck.  Do not bring valuables to the hospital.  [facility] is not responsible for any belongings or valuables.  Contacts, dentures or bridgework may not be worn into surgery.  Leave your suitcase in the car.  After surgery it may be brought to your room.  For patients admitted to the hospital, discharge time will be determined by your treatment team.  Patients discharged the day of surgery will not be allowed to drive home.   Special instructions:  [place] - Preparing for Surgery  Before surgery, you can play an important role.  Because skin is not sterile, your skin needs to be as free of germs as possible.  You can reduce the number of germs on you skin by washing with CHG (chlorahexidine gluconate) soap before surgery.  CHG is an antiseptic cleaner which kills germs and bonds with the skin to continue killing germs even after washing.  Please DO NOT use if you have an  allergy to CHG or antibacterial soaps.  If your skin becomes reddened/irritated stop using the CHG and inform your nurse when you arrive at Short Stay.  Do not shave (including legs and underarms) for at least 48 hours prior to the first CHG shower.  You may shave your face.  Please follow these instructions carefully:   1.  Shower with CHG Soap the night before surgery and the  morning of Surgery.  2.  If you choose to wash your hair, wash your hair first as usual with your normal shampoo.  3.  After you shampoo, rinse your hair and body thoroughly to remove the Shampoo.  4.  Use CHG as you would any other liquid soap.  You can apply chg directly  to the skin and wash gently with scrungie or a clean washcloth.  5.  Apply the CHG Soap to your body ONLY FROM THE NECK DOWN.  Do not use on open wounds or open sores.  Avoid contact with your eyes, ears, mouth and genitals (private parts). Wash genitals (private parts)       with your normal soap.  6.  Wash thoroughly, paying special attention to the area where your surgery  will be performed.  7.  Thoroughly rinse your body with warm water from the neck down.  8.  DO NOT shower/wash with your normal soap after using and rinsing off the CHG  Soap.  9.  Pat yourself dry with a clean towel.            10.  Wear clean pajamas.            11.  Place clean sheets on your bed the night of your first shower and do not sleep with pets.  Day of Surgery  Do not apply any lotions/deoderants the morning of surgery.  Please wear clean clothes to the hospital/surgery center.     Please read over the following fact sheets that you were given. Pain Booklet, Coughing and Deep Breathing and Surgical Site Infection Prevention

## 2017-09-09 ENCOUNTER — Encounter (HOSPITAL_COMMUNITY)
Admission: RE | Admit: 2017-09-09 | Discharge: 2017-09-09 | Disposition: A | Discharge status: Home / Self Care | Payer: 59 | Source: Ambulatory Visit | Attending: Orthopedic Surgery | Admitting: Orthopedic Surgery

## 2017-09-09 ENCOUNTER — Other Ambulatory Visit: Payer: Self-pay

## 2017-09-09 ENCOUNTER — Encounter (HOSPITAL_COMMUNITY): Payer: Self-pay

## 2017-09-09 HISTORY — DX: Inflammatory liver disease, unspecified: K75.9

## 2017-09-09 HISTORY — DX: Anemia, unspecified: D64.9

## 2017-09-09 HISTORY — DX: Unspecified osteoarthritis, unspecified site: M19.90

## 2017-09-09 HISTORY — DX: Nausea with vomiting, unspecified: R11.2

## 2017-09-09 HISTORY — DX: Personal history of urinary calculi: Z87.442

## 2017-09-09 HISTORY — DX: Other specified postprocedural states: Z98.890

## 2017-09-09 LAB — CBC
HCT: 44 % (ref 39.0–52.0)
Hemoglobin: 14.8 g/dL (ref 13.0–17.0)
MCH: 29.5 pg (ref 26.0–34.0)
MCHC: 33.6 g/dL (ref 30.0–36.0)
MCV: 87.6 fL (ref 78.0–100.0)
PLATELETS: 165 10*3/uL (ref 150–400)
RBC: 5.02 MIL/uL (ref 4.22–5.81)
RDW: 13.3 % (ref 11.5–15.5)
WBC: 4.7 10*3/uL (ref 4.0–10.5)

## 2017-09-09 LAB — BASIC METABOLIC PANEL
Anion gap: 7 (ref 5–15)
BUN: 19 mg/dL (ref 6–20)
CALCIUM: 9.5 mg/dL (ref 8.9–10.3)
CO2: 26 mmol/L (ref 22–32)
CREATININE: 1.28 mg/dL — AB (ref 0.61–1.24)
Chloride: 104 mmol/L (ref 101–111)
Glucose, Bld: 93 mg/dL (ref 65–99)
Potassium: 4.2 mmol/L (ref 3.5–5.1)
SODIUM: 137 mmol/L (ref 135–145)

## 2017-09-09 LAB — TYPE AND SCREEN
ABO/RH(D): A POS
ANTIBODY SCREEN: NEGATIVE

## 2017-09-09 LAB — SURGICAL PCR SCREEN
MRSA, PCR: NEGATIVE
Staphylococcus aureus: POSITIVE — AB

## 2017-09-09 LAB — ABO/RH: ABO/RH(D): A POS

## 2017-09-09 MED ORDER — TRANEXAMIC ACID 1000 MG/10ML IV SOLN
1000.0000 mg | INTRAVENOUS | Status: AC
  Administered 2017-09-12: 1000 mg via INTRAVENOUS
  Filled 2017-09-09: qty 1100

## 2017-09-09 MED ORDER — ACETAMINOPHEN 10 MG/ML IV SOLN
1000.0000 mg | INTRAVENOUS | Status: AC
  Administered 2017-09-12: 1000 mg via INTRAVENOUS
  Filled 2017-09-09: qty 100

## 2017-09-09 NOTE — Progress Notes (Signed)
[facility]  Cardiology  Quay Burow Stress Test  04-26-2017 OV  04-06-17

## 2017-09-12 ENCOUNTER — Inpatient Hospital Stay (HOSPITAL_COMMUNITY): Payer: 59 | Admitting: Certified Registered"

## 2017-09-12 ENCOUNTER — Inpatient Hospital Stay (HOSPITAL_COMMUNITY): Payer: 59

## 2017-09-12 ENCOUNTER — Inpatient Hospital Stay (HOSPITAL_COMMUNITY)
Admission: RE | Admit: 2017-09-12 | Discharge: 2017-09-13 | DRG: 470 | Disposition: A | Discharge status: Home / Self Care | Payer: 59 | Source: Ambulatory Visit | Attending: Orthopedic Surgery | Admitting: Orthopedic Surgery

## 2017-09-12 ENCOUNTER — Encounter (HOSPITAL_COMMUNITY)
Admission: RE | Disposition: A | Discharge status: Home / Self Care | Payer: Self-pay | Source: Ambulatory Visit | Attending: Orthopedic Surgery

## 2017-09-12 ENCOUNTER — Other Ambulatory Visit: Payer: Self-pay

## 2017-09-12 ENCOUNTER — Encounter (HOSPITAL_COMMUNITY): Payer: Self-pay | Admitting: Certified Registered"

## 2017-09-12 DIAGNOSIS — Z09 Encounter for follow-up examination after completed treatment for conditions other than malignant neoplasm: Secondary | ICD-10-CM

## 2017-09-12 DIAGNOSIS — Z79899 Other long term (current) drug therapy: Secondary | ICD-10-CM | POA: Diagnosis not present

## 2017-09-12 DIAGNOSIS — Z419 Encounter for procedure for purposes other than remedying health state, unspecified: Secondary | ICD-10-CM

## 2017-09-12 DIAGNOSIS — M25551 Pain in right hip: Secondary | ICD-10-CM | POA: Diagnosis not present

## 2017-09-12 DIAGNOSIS — M7061 Trochanteric bursitis, right hip: Secondary | ICD-10-CM | POA: Diagnosis not present

## 2017-09-12 DIAGNOSIS — E785 Hyperlipidemia, unspecified: Secondary | ICD-10-CM | POA: Diagnosis present

## 2017-09-12 DIAGNOSIS — M1611 Unilateral primary osteoarthritis, right hip: Secondary | ICD-10-CM | POA: Diagnosis not present

## 2017-09-12 DIAGNOSIS — Z96641 Presence of right artificial hip joint: Secondary | ICD-10-CM | POA: Diagnosis not present

## 2017-09-12 HISTORY — PX: TOTAL HIP ARTHROPLASTY: SHX124

## 2017-09-12 LAB — POCT I-STAT 4, (NA,K, GLUC, HGB,HCT)
Glucose, Bld: 121 mg/dL — ABNORMAL HIGH (ref 65–99)
HCT: 36 % — ABNORMAL LOW (ref 39.0–52.0)
Hemoglobin: 12.2 g/dL — ABNORMAL LOW (ref 13.0–17.0)
POTASSIUM: 4.8 mmol/L (ref 3.5–5.1)
SODIUM: 138 mmol/L (ref 135–145)

## 2017-09-12 SURGERY — ARTHROPLASTY, HIP, TOTAL, ANTERIOR APPROACH
Anesthesia: Spinal | Site: Hip | Laterality: Right

## 2017-09-12 MED ORDER — POVIDONE-IODINE 10 % EX SWAB: 2.0000 "application " | Freq: Once | CUTANEOUS | Status: DC

## 2017-09-12 MED ORDER — ONDANSETRON HCL 4 MG PO TABS: 4.0000 mg | ORAL_TABLET | Freq: Four times a day (QID) | ORAL | Status: DC | PRN

## 2017-09-12 MED ORDER — CHLORHEXIDINE GLUCONATE 4 % EX LIQD: 60.0000 mL | Freq: Once | CUTANEOUS | Status: DC

## 2017-09-12 MED ORDER — 0.9 % SODIUM CHLORIDE (POUR BTL) OPTIME
TOPICAL | Status: DC | PRN
  Administered 2017-09-12: 1000 mL

## 2017-09-12 MED ORDER — CEFAZOLIN SODIUM-DEXTROSE 2-4 GM/100ML-% IV SOLN
2.0000 g | Freq: Four times a day (QID) | INTRAVENOUS | Status: AC
  Administered 2017-09-12 – 2017-09-13 (×2): 2 g via INTRAVENOUS
  Filled 2017-09-12 (×3): qty 100

## 2017-09-12 MED ORDER — SODIUM CHLORIDE 0.9 % IJ SOLN
INTRAMUSCULAR | Status: DC | PRN
  Administered 2017-09-12: 29 mL

## 2017-09-12 MED ORDER — HYDROMORPHONE HCL 1 MG/ML IJ SOLN
0.5000 mg | INTRAMUSCULAR | Status: DC | PRN
  Administered 2017-09-12: 1 mg via INTRAVENOUS
  Filled 2017-09-12: qty 1

## 2017-09-12 MED ORDER — MIDAZOLAM HCL 2 MG/2ML IJ SOLN
INTRAMUSCULAR | Status: AC
  Filled 2017-09-12: qty 2

## 2017-09-12 MED ORDER — DEXAMETHASONE SODIUM PHOSPHATE 10 MG/ML IJ SOLN
INTRAMUSCULAR | Status: DC | PRN
  Administered 2017-09-12: 5 mg via INTRAVENOUS

## 2017-09-12 MED ORDER — CEFAZOLIN SODIUM-DEXTROSE 2-4 GM/100ML-% IV SOLN
2.0000 g | INTRAVENOUS | Status: AC
  Administered 2017-09-12: 2 g via INTRAVENOUS

## 2017-09-12 MED ORDER — HYDROCODONE-ACETAMINOPHEN 5-325 MG PO TABS
2.0000 | ORAL_TABLET | ORAL | Status: DC | PRN
  Administered 2017-09-12 – 2017-09-13 (×3): 2 via ORAL
  Filled 2017-09-12 (×2): qty 2

## 2017-09-12 MED ORDER — TRANEXAMIC ACID 1000 MG/10ML IV SOLN
1000.0000 mg | Freq: Once | INTRAVENOUS | Status: AC
  Administered 2017-09-12: 1000 mg via INTRAVENOUS
  Filled 2017-09-12 (×2): qty 10

## 2017-09-12 MED ORDER — LACTATED RINGERS IV SOLN
INTRAVENOUS | Status: DC
  Administered 2017-09-12 (×2): via INTRAVENOUS

## 2017-09-12 MED ORDER — GLYCOPYRROLATE 0.2 MG/ML IV SOSY
PREFILLED_SYRINGE | INTRAVENOUS | Status: DC | PRN
  Administered 2017-09-12: .2 mg via INTRAVENOUS

## 2017-09-12 MED ORDER — MUPIROCIN 2 % EX OINT
1.0000 "application " | TOPICAL_OINTMENT | Freq: Two times a day (BID) | CUTANEOUS | Status: DC
  Administered 2017-09-12 – 2017-09-13 (×2): 1 via NASAL
  Filled 2017-09-12: qty 22

## 2017-09-12 MED ORDER — FENTANYL CITRATE (PF) 100 MCG/2ML IJ SOLN
INTRAMUSCULAR | Status: DC | PRN
  Administered 2017-09-12 (×7): 50 ug via INTRAVENOUS

## 2017-09-12 MED ORDER — DEXAMETHASONE SODIUM PHOSPHATE 10 MG/ML IJ SOLN
10.0000 mg | Freq: Once | INTRAMUSCULAR | Status: AC
  Administered 2017-09-13: 10 mg via INTRAVENOUS
  Filled 2017-09-12: qty 1

## 2017-09-12 MED ORDER — HYDROMORPHONE HCL 1 MG/ML IJ SOLN
0.5000 mg | INTRAMUSCULAR | Status: DC | PRN
  Administered 2017-09-12 (×2): 0.5 mg via INTRAVENOUS

## 2017-09-12 MED ORDER — FENTANYL CITRATE (PF) 250 MCG/5ML IJ SOLN
INTRAMUSCULAR | Status: AC
  Filled 2017-09-12: qty 5

## 2017-09-12 MED ORDER — ALBUMIN HUMAN 5 % IV SOLN
INTRAVENOUS | Status: DC | PRN
  Administered 2017-09-12 (×2): via INTRAVENOUS

## 2017-09-12 MED ORDER — ONDANSETRON HCL 4 MG/2ML IJ SOLN: 4.0000 mg | Freq: Four times a day (QID) | INTRAMUSCULAR | Status: DC | PRN

## 2017-09-12 MED ORDER — POLYETHYLENE GLYCOL 3350 17 G PO PACK: 17.0000 g | PACK | Freq: Every day | ORAL | Status: DC | PRN

## 2017-09-12 MED ORDER — KETOROLAC TROMETHAMINE 15 MG/ML IJ SOLN
7.5000 mg | Freq: Four times a day (QID) | INTRAMUSCULAR | Status: AC
  Administered 2017-09-12 – 2017-09-13 (×4): 7.5 mg via INTRAVENOUS
  Filled 2017-09-12 (×4): qty 1

## 2017-09-12 MED ORDER — PROPOFOL 10 MG/ML IV BOLUS
INTRAVENOUS | Status: DC | PRN
  Administered 2017-09-12: 30 mg via INTRAVENOUS
  Administered 2017-09-12: 50 mg via INTRAVENOUS
  Administered 2017-09-12: 20 mg via INTRAVENOUS
  Administered 2017-09-12: 30 mg via INTRAVENOUS

## 2017-09-12 MED ORDER — CEFAZOLIN SODIUM-DEXTROSE 2-4 GM/100ML-% IV SOLN
INTRAVENOUS | Status: AC
Start: 2017-09-12 — End: 2017-09-12
  Filled 2017-09-12: qty 100

## 2017-09-12 MED ORDER — METHOCARBAMOL 500 MG PO TABS
ORAL_TABLET | ORAL | Status: AC
  Administered 2017-09-12: 500 mg via ORAL
  Filled 2017-09-12: qty 1

## 2017-09-12 MED ORDER — ALUM & MAG HYDROXIDE-SIMETH 200-200-20 MG/5ML PO SUSP: 30.0000 mL | ORAL | Status: DC | PRN

## 2017-09-12 MED ORDER — ASPIRIN 81 MG PO CHEW
81.0000 mg | CHEWABLE_TABLET | Freq: Two times a day (BID) | ORAL | Status: DC
  Administered 2017-09-13: 81 mg via ORAL
  Filled 2017-09-12 (×2): qty 1

## 2017-09-12 MED ORDER — ACETAMINOPHEN 650 MG RE SUPP: 650.0000 mg | RECTAL | Status: DC | PRN

## 2017-09-12 MED ORDER — HYDROCODONE-ACETAMINOPHEN 5-325 MG PO TABS: 1.0000 | ORAL_TABLET | ORAL | Status: DC | PRN

## 2017-09-12 MED ORDER — PROPOFOL 500 MG/50ML IV EMUL
INTRAVENOUS | Status: DC | PRN
  Administered 2017-09-12: 200 ug/kg/min via INTRAVENOUS

## 2017-09-12 MED ORDER — METHOCARBAMOL 500 MG PO TABS
500.0000 mg | ORAL_TABLET | Freq: Four times a day (QID) | ORAL | Status: DC | PRN
  Administered 2017-09-12: 500 mg via ORAL

## 2017-09-12 MED ORDER — BUPIVACAINE-EPINEPHRINE (PF) 0.5% -1:200000 IJ SOLN
INTRAMUSCULAR | Status: DC | PRN
  Administered 2017-09-12: 30 mL

## 2017-09-12 MED ORDER — DEXTROSE 5 % IV SOLN
INTRAVENOUS | Status: DC | PRN
  Administered 2017-09-12: 20 ug/min via INTRAVENOUS

## 2017-09-12 MED ORDER — CHLORHEXIDINE GLUCONATE CLOTH 2 % EX PADS
6.0000 | MEDICATED_PAD | Freq: Every day | CUTANEOUS | Status: DC
  Administered 2017-09-13: 6 via TOPICAL

## 2017-09-12 MED ORDER — DIPHENHYDRAMINE HCL 12.5 MG/5ML PO ELIX: 12.5000 mg | ORAL_SOLUTION | ORAL | Status: DC | PRN

## 2017-09-12 MED ORDER — MIDAZOLAM HCL 2 MG/2ML IJ SOLN
INTRAMUSCULAR | Status: DC | PRN
  Administered 2017-09-12: 2 mg via INTRAVENOUS

## 2017-09-12 MED ORDER — HYDROCODONE-ACETAMINOPHEN 5-325 MG PO TABS
ORAL_TABLET | ORAL | Status: AC
  Administered 2017-09-12: 2 via ORAL
  Filled 2017-09-12: qty 2

## 2017-09-12 MED ORDER — DEXTROSE 5 % IV SOLN
500.0000 mg | Freq: Four times a day (QID) | INTRAVENOUS | Status: DC | PRN
  Filled 2017-09-12: qty 5

## 2017-09-12 MED ORDER — SODIUM CHLORIDE 0.9 % IV SOLN
INTRAVENOUS | Status: DC
  Administered 2017-09-12 – 2017-09-13 (×2): via INTRAVENOUS

## 2017-09-12 MED ORDER — PHENOL 1.4 % MT LIQD: 1.0000 | OROMUCOSAL | Status: DC | PRN

## 2017-09-12 MED ORDER — METOCLOPRAMIDE HCL 5 MG PO TABS: 5.0000 mg | ORAL_TABLET | Freq: Three times a day (TID) | ORAL | Status: DC | PRN

## 2017-09-12 MED ORDER — ONDANSETRON HCL 4 MG/2ML IJ SOLN
INTRAMUSCULAR | Status: DC | PRN
  Administered 2017-09-12: 4 mg via INTRAVENOUS

## 2017-09-12 MED ORDER — BUPIVACAINE-EPINEPHRINE (PF) 0.5% -1:200000 IJ SOLN
INTRAMUSCULAR | Status: AC
  Filled 2017-09-12: qty 30

## 2017-09-12 MED ORDER — SODIUM CHLORIDE 0.9 % IV SOLN
INTRAVENOUS | Status: AC | PRN
  Administered 2017-09-12: 1000 mL via INTRAMUSCULAR

## 2017-09-12 MED ORDER — SODIUM CHLORIDE 0.9 % IR SOLN
Status: DC | PRN
  Administered 2017-09-12: 3000 mL

## 2017-09-12 MED ORDER — ACETAMINOPHEN 325 MG PO TABS: 650.0000 mg | ORAL_TABLET | ORAL | Status: DC | PRN

## 2017-09-12 MED ORDER — PHENYLEPHRINE 40 MCG/ML (10ML) SYRINGE FOR IV PUSH (FOR BLOOD PRESSURE SUPPORT)
PREFILLED_SYRINGE | INTRAVENOUS | Status: DC | PRN
  Administered 2017-09-12: 120 ug via INTRAVENOUS

## 2017-09-12 MED ORDER — KETOROLAC TROMETHAMINE 30 MG/ML IJ SOLN
INTRAMUSCULAR | Status: DC | PRN
  Administered 2017-09-12: 30 mg via INTRAVENOUS

## 2017-09-12 MED ORDER — SODIUM CHLORIDE 0.9 % IV SOLN: INTRAVENOUS | Status: DC

## 2017-09-12 MED ORDER — HYDROMORPHONE HCL 1 MG/ML IJ SOLN
INTRAMUSCULAR | Status: AC
  Administered 2017-09-12: 0.5 mg via INTRAVENOUS
  Filled 2017-09-12: qty 1

## 2017-09-12 MED ORDER — MENTHOL 3 MG MT LOZG: 1.0000 | LOZENGE | OROMUCOSAL | Status: DC | PRN

## 2017-09-12 MED ORDER — KETOROLAC TROMETHAMINE 30 MG/ML IJ SOLN
INTRAMUSCULAR | Status: AC
  Filled 2017-09-12: qty 1

## 2017-09-12 MED ORDER — DIPHENHYDRAMINE HCL 50 MG/ML IJ SOLN
INTRAMUSCULAR | Status: DC | PRN
  Administered 2017-09-12: 6.25 mg via INTRAVENOUS

## 2017-09-12 MED ORDER — DOCUSATE SODIUM 100 MG PO CAPS
100.0000 mg | ORAL_CAPSULE | Freq: Two times a day (BID) | ORAL | Status: DC
  Administered 2017-09-12 – 2017-09-13 (×2): 100 mg via ORAL
  Filled 2017-09-12 (×2): qty 1

## 2017-09-12 MED ORDER — METOCLOPRAMIDE HCL 5 MG/ML IJ SOLN: 5.0000 mg | Freq: Three times a day (TID) | INTRAMUSCULAR | Status: DC | PRN

## 2017-09-12 MED ORDER — SENNA 8.6 MG PO TABS
2.0000 | ORAL_TABLET | Freq: Every day | ORAL | Status: DC
  Administered 2017-09-12: 17.2 mg via ORAL
  Filled 2017-09-12: qty 2

## 2017-09-12 MED ORDER — EPHEDRINE SULFATE-NACL 50-0.9 MG/10ML-% IV SOSY
PREFILLED_SYRINGE | INTRAVENOUS | Status: DC | PRN
  Administered 2017-09-12 (×3): 10 mg via INTRAVENOUS
  Administered 2017-09-12: 15 mg via INTRAVENOUS

## 2017-09-12 SURGICAL SUPPLY — 54 items
ALCOHOL ISOPROPYL (RUBBING) (MISCELLANEOUS) ×3 IMPLANT
BIT DRILL RINGLOC 3.2MMX20 (BIT) ×1 IMPLANT
BIT DRILL RINGLOC 3.2X20 (BIT) ×1
BLADE CLIPPER SURG (BLADE) IMPLANT
CAPT HIP TOTAL 2 ×3 IMPLANT
CHLORAPREP W/TINT 26ML (MISCELLANEOUS) ×3 IMPLANT
COVER SURGICAL LIGHT HANDLE (MISCELLANEOUS) ×3 IMPLANT
DERMABOND ADVANCED (GAUZE/BANDAGES/DRESSINGS) ×4
DERMABOND ADVANCED .7 DNX12 (GAUZE/BANDAGES/DRESSINGS) ×2 IMPLANT
DRAPE C-ARM 42X72 X-RAY (DRAPES) ×3 IMPLANT
DRAPE STERI IOBAN 125X83 (DRAPES) ×3 IMPLANT
DRAPE U-SHAPE 47X51 STRL (DRAPES) ×9 IMPLANT
DRILL BIT RINGLOC 3.2MMX20 (BIT) ×2
DRSG AQUACEL AG ADV 3.5X10 (GAUZE/BANDAGES/DRESSINGS) ×3 IMPLANT
ELECT BLADE 4.0 EZ CLEAN MEGAD (MISCELLANEOUS) ×3
ELECT REM PT RETURN 9FT ADLT (ELECTROSURGICAL) ×3
ELECTRODE BLDE 4.0 EZ CLN MEGD (MISCELLANEOUS) ×1 IMPLANT
ELECTRODE REM PT RTRN 9FT ADLT (ELECTROSURGICAL) ×1 IMPLANT
EVACUATOR 1/8 PVC DRAIN (DRAIN) IMPLANT
GLOVE BIO SURGEON STRL SZ8.5 (GLOVE) ×6 IMPLANT
GLOVE BIOGEL PI IND STRL 8.5 (GLOVE) ×1 IMPLANT
GLOVE BIOGEL PI INDICATOR 8.5 (GLOVE) ×2
GOWN STRL REUS W/ TWL LRG LVL3 (GOWN DISPOSABLE) ×2 IMPLANT
GOWN STRL REUS W/TWL 2XL LVL3 (GOWN DISPOSABLE) ×3 IMPLANT
GOWN STRL REUS W/TWL LRG LVL3 (GOWN DISPOSABLE) ×4
HANDPIECE INTERPULSE COAX TIP (DISPOSABLE) ×2
HOOD PEEL AWAY FACE SHEILD DIS (HOOD) ×6 IMPLANT
KIT BASIN OR (CUSTOM PROCEDURE TRAY) ×3 IMPLANT
KIT ROOM TURNOVER OR (KITS) ×3 IMPLANT
MANIFOLD NEPTUNE II (INSTRUMENTS) ×3 IMPLANT
MARKER SKIN DUAL TIP RULER LAB (MISCELLANEOUS) ×6 IMPLANT
NEEDLE SPNL 18GX3.5 QUINCKE PK (NEEDLE) ×3 IMPLANT
NS IRRIG 1000ML POUR BTL (IV SOLUTION) ×3 IMPLANT
PACK TOTAL JOINT (CUSTOM PROCEDURE TRAY) ×3 IMPLANT
PACK UNIVERSAL I (CUSTOM PROCEDURE TRAY) ×3 IMPLANT
PAD ARMBOARD 7.5X6 YLW CONV (MISCELLANEOUS) ×6 IMPLANT
SAW OSC TIP CART 19.5X105X1.3 (SAW) ×3 IMPLANT
SEALER BIPOLAR AQUA 6.0 (INSTRUMENTS) IMPLANT
SET HNDPC FAN SPRY TIP SCT (DISPOSABLE) ×1 IMPLANT
SOL PREP POV-IOD 4OZ 10% (MISCELLANEOUS) ×3 IMPLANT
SUT ETHIBOND NAB CT1 #1 30IN (SUTURE) ×6 IMPLANT
SUT MNCRL AB 3-0 PS2 18 (SUTURE) ×3 IMPLANT
SUT MON AB 2-0 CT1 36 (SUTURE) ×3 IMPLANT
SUT VIC AB 1 CT1 27 (SUTURE) ×4
SUT VIC AB 1 CT1 27XBRD ANBCTR (SUTURE) ×2 IMPLANT
SUT VIC AB 2-0 CT1 27 (SUTURE) ×2
SUT VIC AB 2-0 CT1 TAPERPNT 27 (SUTURE) ×1 IMPLANT
SUT VLOC 180 0 24IN GS25 (SUTURE) ×3 IMPLANT
SYR 50ML LL SCALE MARK (SYRINGE) ×3 IMPLANT
TOWEL OR 17X24 6PK STRL BLUE (TOWEL DISPOSABLE) ×3 IMPLANT
TOWEL OR 17X26 10 PK STRL BLUE (TOWEL DISPOSABLE) ×3 IMPLANT
TRAY CATH 16FR W/PLASTIC CATH (SET/KITS/TRAYS/PACK) IMPLANT
TRAY FOLEY CATH SILVER 16FR (SET/KITS/TRAYS/PACK) IMPLANT
WATER STERILE IRR 1000ML POUR (IV SOLUTION) ×9 IMPLANT

## 2017-09-12 NOTE — Anesthesia Procedure Notes (Signed)
Procedure Name: LMA Insertion Date/Time: 09/12/2017 2:42 PM Performed by: Imagene Riches, CRNA Pre-anesthesia Checklist: Patient identified, Emergency Drugs available, Suction available and Patient being monitored Patient Re-evaluated:Patient Re-evaluated prior to induction Oxygen Delivery Method: Circle System Utilized Preoxygenation: Pre-oxygenation with 100% oxygen Induction Type: IV induction Ventilation: Mask ventilation without difficulty LMA: LMA inserted LMA Size: 4.0 Number of attempts: 1 Airway Equipment and Method: Bite block Placement Confirmation: positive ETCO2 Tube secured with: Tape Dental Injury: Teeth and Oropharynx as per pre-operative assessment

## 2017-09-12 NOTE — Discharge Instructions (Signed)
°Dr. Anyia Gierke °Joint Replacement Specialist °[facility] °[address] °[place], [place] [address] °[phone] ° ° °TOTAL HIP REPLACEMENT POSTOPERATIVE DIRECTIONS ° ° ° °Hip Rehabilitation, Guidelines Following Surgery  ° °WEIGHT BEARING °Weight bearing as tolerated with assist device (walker, cane, etc) as directed, use it as long as suggested by your surgeon or therapist, typically at least 4-6 weeks. ° °The results of a hip operation are greatly improved after range of motion and muscle strengthening exercises. Follow all safety measures which are given to protect your hip. If any of these exercises cause increased pain or swelling in your joint, decrease the amount until you are comfortable again. Then slowly increase the exercises. Call your caregiver if you have problems or questions.  ° °HOME CARE INSTRUCTIONS  °Most of the following instructions are designed to prevent the dislocation of your new hip.  °Remove items at home which could result in a fall. This includes throw rugs or furniture in walking pathways.  °Continue medications as instructed at time of discharge. °· You may have some home medications which will be placed on hold until you complete the course of blood thinner medication. °· You may start showering once you are discharged home. Do not remove your dressing. °Do not put on socks or shoes without following the instructions of your caregivers.   °Sit on chairs with arms. Use the chair arms to help push yourself up when arising.  °Arrange for the use of a toilet seat elevator so you are not sitting low.  °· Walk with walker as instructed.  °You may resume a sexual relationship in one month or when given the OK by your caregiver.  °Use walker as long as suggested by your caregivers.  °You may put full weight on your legs and walk as much as is comfortable. °Avoid periods of inactivity such as sitting longer than an hour when not asleep. This helps prevent  blood clots.  °You may return to work once you are cleared by your surgeon.  °Do not drive a car for 6 weeks or until released by your surgeon.  °Do not drive while taking narcotics.  °Wear elastic stockings for two weeks following surgery during the day but you may remove then at night.  °Make sure you keep all of your appointments after your operation with all of your doctors and caregivers. You should call the office at the above phone number and make an appointment for approximately two weeks after the date of your surgery. °Please pick up a stool softener and laxative for home use as long as you are requiring pain medications. °· ICE to the affected hip every three hours for 30 minutes at a time and then as needed for pain and swelling. Continue to use ice on the hip for pain and swelling from surgery. You may notice swelling that will progress down to the foot and ankle.  This is normal after surgery.  Elevate the leg when you are not up walking on it.   °It is important for you to complete the blood thinner medication as prescribed by your doctor. °· Continue to use the breathing machine which will help keep your temperature down.  It is common for your temperature to cycle up and down following surgery, especially at night when you are not up moving around and exerting yourself.  The breathing machine keeps your lungs expanded and your temperature down. ° °RANGE OF MOTION AND STRENGTHENING EXERCISES  °These exercises are   designed to help you keep full movement of your hip joint. Follow your caregiver's or physical therapist's instructions. Perform all exercises about fifteen times, three times per day or as directed. Exercise both hips, even if you have had only one joint replacement. These exercises can be done on a training (exercise) mat, on the floor, on a table or on a bed. Use whatever works the best and is most comfortable for you. Use music or television while you are exercising so that the exercises  are a pleasant break in your day. This will make your life better with the exercises acting as a break in routine you can look forward to.  °Lying on your back, slowly slide your foot toward your buttocks, raising your knee up off the floor. Then slowly slide your foot back down until your leg is straight again.  °Lying on your back spread your legs as far apart as you can without causing discomfort.  °Lying on your side, raise your upper leg and foot straight up from the floor as far as is comfortable. Slowly lower the leg and repeat.  °Lying on your back, tighten up the muscle in the front of your thigh (quadriceps muscles). You can do this by keeping your leg straight and trying to raise your heel off the floor. This helps strengthen the largest muscle supporting your knee.  °Lying on your back, tighten up the muscles of your buttocks both with the legs straight and with the knee bent at a comfortable angle while keeping your heel on the floor.  ° °SKILLED REHAB INSTRUCTIONS: °If the patient is transferred to a skilled rehab facility following release from the hospital, a list of the current medications will be sent to the facility for the patient to continue.  When discharged from the skilled rehab facility, please have the facility set up the patient's Home Health Physical Therapy prior to being released. Also, the skilled facility will be responsible for providing the patient with their medications at time of release from the facility to include their pain medication and their blood thinner medication. If the patient is still at the rehab facility at time of the two week follow up appointment, the skilled rehab facility will also need to assist the patient in arranging follow up appointment in our office and any transportation needs. ° °MAKE SURE YOU:  °Understand these instructions.  °Will watch your condition.  °Will get help right away if you are not doing well or get worse. ° °Pick up stool softner and  laxative for home use following surgery while on pain medications. °Do not remove your dressing. °The dressing is waterproof--it is OK to take showers. °Continue to use ice for pain and swelling after surgery. °Do not use any lotions or creams on the incision until instructed by your surgeon. °Total Hip Protocol. ° ° °

## 2017-09-12 NOTE — Interval H&P Note (Signed)
History and Physical Interval Note:  09/12/2017 11:51 AM  Bruce Hart  has presented today for surgery, with the diagnosis of Degenerative joint disease right hip  The various methods of treatment have been discussed with the patient and family. After consideration of risks, benefits and other options for treatment, the patient has consented to  Procedure(s) with comments: RIGHT TOTAL HIP ARTHROPLASTY ANTERIOR APPROACH (Right) - Needs RNFA as a surgical intervention .  The patient's history has been reviewed, patient examined, no change in status, stable for surgery.  I have reviewed the patient's chart and labs.  Questions were answered to the patient's satisfaction.     [name]

## 2017-09-12 NOTE — Op Note (Signed)
OPERATIVE REPORT  SURGEON: Rod Can, MD   ASSISTANT: Ky Barban, RNFA.  PREOPERATIVE DIAGNOSIS: Right hip arthritis.   POSTOPERATIVE DIAGNOSIS: Right hip arthritis.   PROCEDURE: Right total hip arthroplasty, anterior approach.   IMPLANTS: Biomet Taperloc Complete Microplasty stem, size 15 x 115, standard offset. Biomet G7 Cup, size 60 mm. Biomet E1 liner, size 36 mm, G, neutral. Biomet metal head ball, size 36 + 0 mm. 6.9m cancellous bone screw x1.   ANESTHESIA:  Spinal  ESTIMATED BLOOD LOSS:-1200 mL    ANTIBIOTICS: 2 g Ancef.  DRAINS: None.  COMPLICATIONS: None.   CONDITION: PACU - hemodynamically stable.   BRIEF CLINICAL NOTE: Bruce Hart is a 51 y.o. male with a long-standing history of Right hip arthritis. After failing conservative management, the patient was indicated for total hip arthroplasty. The risks, benefits, and alternatives to the procedure were explained, and the patient elected to proceed.  PROCEDURE IN DETAIL: Surgical site was marked by myself in the pre-op holding area. Once inside the operating room, spinal anesthesia was obtained, and a foley catheter was inserted. The patient was then positioned on the Hana table. All bony prominences were well padded. The hip was prepped and draped in the normal sterile surgical fashion. A time-out was called verifying side and site of surgery. The patient received IV antibiotics within 60 minutes of beginning the procedure.  The direct anterior approach to the hip was performed through the Hueter interval. Lateral femoral circumflex vessels were treated with the Auqumantys. The anterior capsule was exposed and an inverted T capsulotomy was made.The femoral neck cut was made to the level of the templated cut. A corkscrew was placed into the head and the head was removed. The femoral head was found to have eburnated bone. The head was passed to the back table and was measured.  Acetabular exposure  was achieved, and the pulvinar and labrum were excised. Sequential reaming of the acetabulum was then performed up to a size 59 mm reamer. A 60 mm cup was then opened and impacted into place at approximately 40 degrees of abduction and 20 degrees of anteversion. I elected to augment the already acceptable pressfit fixation with a single 6.5 mm cancellous bone screw. The final polyethylene liner was impacted into place and acetabular osteophytes were removed.   I then gained femoral exposure taking care to protect the abductors and greater trochanter. This was performed using standard external rotation, extension, and adduction. The capsule was peeled off the inner aspect of the greater trochanter, taking care to preserve the short external rotators. A cookie cutter was used to enter the femoral canal, and then the femoral canal finder was placed. Sequential broaching was performed up to a size 15. Calcar planer was used on the femoral neck remnant. I placed a standard offset neck and a trial head ball. The hip was reduced. Leg lengths and offset were checked fluoroscopically. The hip was dislocated and trial components were removed. The final implants were placed, and the hip was reduced.  Fluoroscopy was used to confirm component position and leg lengths. At 90 degrees of external rotation and full extension, the hip was stable to an anterior directed force.  The wound was copiously irrigated with normal saline using pulse lavage. Marcaine solution was injected into the periarticular soft tissue. The wound was closed in layers using #1 Vicryl and V-Loc for the fascia, 2-0 Vicryl for the subcutaneous fat, 2-0 Monocryl for the deep dermal layer, 3-0 running Monocryl subcuticular stitch, and Dermabond  for the skin. Once the glue was fully dried, an Aquacell Ag dressing was applied. The patient was transported to the recovery room in stable condition. Sponge, needle, and instrument counts were  correct at the end of the case x2. The patient tolerated the procedure well and there were no known complications.

## 2017-09-12 NOTE — Anesthesia Procedure Notes (Signed)
Procedure Name: MAC Date/Time: 09/12/2017 12:44 PM Performed by: Imagene Riches, CRNA Pre-anesthesia Checklist: Patient identified, Emergency Drugs available, Suction available and Patient being monitored Patient Re-evaluated:Patient Re-evaluated prior to induction Oxygen Delivery Method: Simple face mask

## 2017-09-12 NOTE — OR Nursing (Signed)
1550: in&out cath=650cc cyu, per protocol, no trauma.

## 2017-09-12 NOTE — Transfer of Care (Signed)
Immediate Anesthesia Transfer of Care Note  Patient: Bruce Hart  Procedure(s) Performed: RIGHT TOTAL HIP ARTHROPLASTY ANTERIOR APPROACH (Right Hip)  Patient Location: PACU  Anesthesia Type:General and Spinal  Level of Consciousness: awake, alert , oriented and patient cooperative  Airway & Oxygen Therapy: Patient Spontanous Breathing  Post-op Assessment: Report given to RN and Post -op Vital signs reviewed and stable  Post vital signs: Reviewed and stable  Last Vitals:  Vitals:   09/12/17 1009 09/12/17 1601  BP: 128/70   Pulse:    Resp:    Temp:  36.4 C  SpO2:      Last Pain:  Vitals:   09/12/17 1601  TempSrc:   PainSc: (P) 0-No pain         Complications: No apparent anesthesia complications

## 2017-09-12 NOTE — Anesthesia Preprocedure Evaluation (Signed)
Anesthesia Evaluation  Patient identified by MRN, date of birth, ID band Patient awake    Reviewed: Allergy & Precautions, NPO status , Patient's Chart, lab work & pertinent test results  History of Anesthesia Complications (+) PONV  Airway Mallampati: I  TM Distance: >3 FB Neck ROM: Full    Dental no notable dental hx.    Pulmonary neg pulmonary ROS,    breath sounds clear to auscultation       Cardiovascular negative cardio ROS   Rhythm:Regular Rate:Normal     Neuro/Psych negative neurological ROS     GI/Hepatic negative GI ROS, (+) Hepatitis -  Endo/Other  negative endocrine ROS  Renal/GU negative Renal ROS     Musculoskeletal  (+) Arthritis ,   Abdominal   Peds  Hematology negative hematology ROS (+)   Anesthesia Other Findings   Reproductive/Obstetrics                             Anesthesia Physical Anesthesia Plan  ASA: II  Anesthesia Plan: Spinal   Post-op Pain Management:    Induction: Intravenous  PONV Risk Score and Plan: 2 and Treatment may vary due to age or medical condition  Airway Management Planned: Natural Airway and Simple Face Mask  Additional Equipment:   Intra-op Plan:   Post-operative Plan:   Informed Consent: I have reviewed the patients History and Physical, chart, labs and discussed the procedure including the risks, benefits and alternatives for the proposed anesthesia with the patient or authorized representative who has indicated his/her understanding and acceptance.   Dental advisory given  Plan Discussed with: CRNA  Anesthesia Plan Comments:         Anesthesia Quick Evaluation

## 2017-09-13 ENCOUNTER — Encounter (HOSPITAL_COMMUNITY): Payer: Self-pay | Admitting: Orthopedic Surgery

## 2017-09-13 LAB — CBC
HCT: 31.7 % — ABNORMAL LOW (ref 39.0–52.0)
Hemoglobin: 10.7 g/dL — ABNORMAL LOW (ref 13.0–17.0)
MCH: 29.2 pg (ref 26.0–34.0)
MCHC: 33.8 g/dL (ref 30.0–36.0)
MCV: 86.4 fL (ref 78.0–100.0)
PLATELETS: 132 10*3/uL — AB (ref 150–400)
RBC: 3.67 MIL/uL — ABNORMAL LOW (ref 4.22–5.81)
RDW: 13 % (ref 11.5–15.5)
WBC: 10.9 10*3/uL — ABNORMAL HIGH (ref 4.0–10.5)

## 2017-09-13 LAB — BASIC METABOLIC PANEL
Anion gap: 9 (ref 5–15)
BUN: 15 mg/dL (ref 6–20)
CALCIUM: 8.4 mg/dL — AB (ref 8.9–10.3)
CO2: 22 mmol/L (ref 22–32)
Chloride: 102 mmol/L (ref 101–111)
Creatinine, Ser: 1.07 mg/dL (ref 0.61–1.24)
GFR calc non Af Amer: >60 mL/min (ref >60)
Glucose, Bld: 138 mg/dL — ABNORMAL HIGH (ref 65–99)
Potassium: 4.5 mmol/L (ref 3.5–5.1)
SODIUM: 133 mmol/L — AB (ref 135–145)

## 2017-09-13 MED ORDER — ONDANSETRON HCL 4 MG PO TABS: 4.0000 mg | ORAL_TABLET | Freq: Four times a day (QID) | ORAL | 0 refills | Status: DC | PRN

## 2017-09-13 MED ORDER — SENNA 8.6 MG PO TABS: 2.0000 | ORAL_TABLET | Freq: Every day | ORAL | 0 refills | Status: DC

## 2017-09-13 MED ORDER — DOCUSATE SODIUM 100 MG PO CAPS: 100.0000 mg | ORAL_CAPSULE | Freq: Two times a day (BID) | ORAL | 1 refills | Status: DC

## 2017-09-13 MED ORDER — ASPIRIN 81 MG PO CHEW: 81.0000 mg | CHEWABLE_TABLET | Freq: Two times a day (BID) | ORAL | 1 refills | Status: DC

## 2017-09-13 MED ORDER — METHOCARBAMOL 500 MG PO TABS: 500.0000 mg | ORAL_TABLET | Freq: Four times a day (QID) | ORAL | 1 refills | Status: DC | PRN

## 2017-09-13 MED ORDER — HYDROCODONE-ACETAMINOPHEN 5-325 MG PO TABS: 1.0000 | ORAL_TABLET | Freq: Four times a day (QID) | ORAL | 0 refills | Status: DC | PRN

## 2017-09-13 MED FILL — DOK 100 MG SOFTGEL: 100 | 50 days supply | Qty: 100 | Fill #0

## 2017-09-13 MED FILL — HYDROCODON-APAP 5-325: 5-325 | 7 days supply | Qty: 60 | Fill #0

## 2017-09-13 MED FILL — SM SENNA LAXATIVE 8.6 MG TA: 8.6 | 50 days supply | Qty: 100 | Fill #0

## 2017-09-13 MED FILL — ONDANSETRON HCL 4 MG TABLET: 4 | 5 days supply | Qty: 20 | Fill #0

## 2017-09-13 MED FILL — ASPIRIN CHILD 81 MG TAB CHE: 81 | 36 days supply | Qty: 72 | Fill #0

## 2017-09-13 MED FILL — METHOCARBAMOL 500 MG TABS: 500 | 5 days supply | Qty: 20 | Fill #0

## 2017-09-13 NOTE — Care Management Note (Addendum)
Case Management Note  Patient Details  Name: Reynol Arnone MRN: [id number] Date of Birth: June 19, 1966  Subjective/Objective:                    Action/Plan: Per MD note no home health required  Spoke to patient and wife at bedside. Await MD visit. They believe discharge plan will be home with no home health , instead HEP ( Home exercise plan ) . Ordered DME to be delivered  to bedside. Expected Discharge Date:                  Expected Discharge Plan:  Home/Self Care  In-House Referral:     Discharge planning Services  CM Consult  Post Acute Care Choice:    Choice offered to:  Patient, Spouse  DME Arranged:  3-N-1, Walker rolling DME Agency:  [place]:    [place] Agency:     Status of Service:  In process, will continue to follow  If discussed at Long Length of Stay Meetings, dates discussed:    Additional Comments:  Marilu Favre, RN 09/13/2017, 12:21 PM

## 2017-09-13 NOTE — Evaluation (Signed)
Physical Therapy Evaluation Patient Details Name: Bruce Hart MRN: [id number] DOB: [birth year] Today's Date: 09/13/2017   History of Present Illness  Bruce Hart, 51 y.o. male, has a history of pain and functional disability in the right hip(s) due to arthritis and patient has failed non-surgical conservative treatments for greater than 12 weeks to include NSAID's and/or analgesics, flexibility and strengthening excercises, use of assistive devices, weight reduction as appropriate and activity modification.  Onset of symptoms was gradual starting 2 years ago with gradually worsening course since that time.The patient noted no past surgery on the right hip(s).  Patient currently rates pain in the right hip at 10 out of 10 with activity. Patient has night pain, worsening of pain with activity and weight bearing, pain that interfers with activities of daily living, pain with passive range of motion and crepitus. Patient has evidence of subchondral cysts, subchondral sclerosis, periarticular osteophytes and joint space narrowing by imaging studies. This condition presents safety issues increasing the risk of falls.  There is no current active infection.  Clinical Impression  Patient received sitting at EOB with spouse and RN present, pleasant and willing to participate in PT. Patient is very independent and high level at baseline, reporting no assistive devices or equipment at home; note that they do have a considerable number of stairs inside of their home that he will have to navigate. Patient able to perform all functional mobility and gait with RW with min guard today. Plan to trial stairs and assign hip HEP during session this afternoon. Patient left sitting at EOB with spouse in room, all needs met and questions/concerns addressed this morning.     Follow Up Recommendations DC plan and follow up therapy as arranged by surgeon    Equipment Recommendations  Standard walker    Recommendations for  Other Services       Precautions / Restrictions Precautions Precautions: Anterior Hip Precaution Comments: R anterior hip  Restrictions Weight Bearing Restrictions: Yes RLE Weight Bearing: Weight bearing as tolerated      Mobility  Bed Mobility               General bed mobility comments: DNT, patient received sitting up at EOB   Transfers Overall transfer level: Needs assistance Equipment used: Rolling walker (2 wheeled) Transfers: Sit to/from Stand Sit to Stand: Min guard         General transfer comment: min verbal cues for safety/sequencing   Ambulation/Gait Ambulation/Gait assistance: Min guard Ambulation Distance (Feet): 70 Feet Assistive device: Rolling walker (2 wheeled) Gait Pattern/deviations: Step-through pattern;Decreased step length - left;Decreased stance time - right;Decreased dorsiflexion - right        Stairs            Wheelchair Mobility    Modified Rankin (Stroke Patients Only)       Balance Overall balance assessment: No apparent balance deficits (not formally assessed)                                           Pertinent Vitals/Pain Pain Assessment: 0-10 Pain Score: 4  Pain Location: anterior R hip  Pain Descriptors / Indicators: Aching;Sore;Tightness Pain Intervention(s): Monitored during session    Home Living Family/patient expects to be discharged to:: Private residence Living Arrangements: Spouse/significant other;Children;Other relatives Available Help at Discharge: Family Type of Home: House Home Access: Stairs to enter   [employer] of  Steps: 2 with one railing  Home Layout: Two level Home Equipment: None Additional Comments: patient very high level at baseline, reports he does not have any assistive or adaptive equipment at home     Prior Function Level of Independence: Independent               Hand Dominance        Extremity/Trunk Assessment   Upper Extremity  Assessment Upper Extremity Assessment: Overall WFL for tasks assessed    Lower Extremity Assessment Lower Extremity Assessment: RLE deficits/detail RLE Deficits / Details: mild post-surgical weakness and guarding as would be anticipated     Cervical / Trunk Assessment Cervical / Trunk Assessment: Normal  Communication   Communication: No difficulties  Cognition Arousal/Alertness: Awake/alert Behavior During Therapy: WFL for tasks assessed/performed Overall Cognitive Status: Within Functional Limits for tasks assessed                                        General Comments General comments (skin integrity, edema, etc.): education provided regarding anterior hip WB status, general education regarding anterior hip in relation to mobility     Exercises     Assessment/Plan    PT Assessment Patient needs continued PT services  PT Problem List Decreased strength;Decreased coordination;Decreased activity tolerance;Decreased mobility       PT Treatment Interventions DME instruction;Therapeutic activities;Gait training;Therapeutic exercise;Patient/family education;Stair training;Balance training;Functional mobility training;Neuromuscular re-education    PT Goals (Current goals can be found in the Care Plan section)  Acute Rehab PT Goals Patient Stated Goal: to go home  PT Goal Formulation: With patient/family Time For Goal Achievement: 09/27/17 Potential to Achieve Goals: Good    Frequency 7X/week   Barriers to discharge        Co-evaluation               AM-PAC PT "6 Clicks" Daily Activity  Outcome Measure Difficulty turning over in bed (including adjusting bedclothes, sheets and blankets)?: None Difficulty moving from lying on back to sitting on the side of the bed? : None Difficulty sitting down on and standing up from a chair with arms (e.g., wheelchair, bedside commode, etc,.)?: None Help needed moving to and from a bed to chair (including a  wheelchair)?: None Help needed walking in hospital room?: None Help needed climbing 3-5 steps with a railing? : A Little 6 Click Score: 23    End of Session   Activity Tolerance: Patient tolerated treatment well Patient left: in bed;with family/visitor present(sitting at EOB )   PT Visit Diagnosis: Muscle weakness (generalized) (M62.81);Difficulty in walking, not elsewhere classified (R26.2)    Time: 1610-9604 PT Time Calculation (min) (ACUTE ONLY): 18 min   Charges:   PT Evaluation $PT Eval Low Complexity: 1 Low     PT G Codes:   PT G-Codes **NOT FOR INPATIENT CLASS** Functional Assessment Tool Used: AM-PAC 6 Clicks Basic Mobility;Clinical judgement Functional Limitation: Mobility: Walking and moving around Mobility: Walking and Moving Around Current Status (V4098): At least 20 percent but less than 40 percent impaired, limited or restricted Mobility: Walking and Moving Around Goal Status [phone]): At least 1 percent but less than 20 percent impaired, limited or restricted    Deniece Ree PT, DPT, CBIS  Supplemental Physical Therapist [facility]

## 2017-09-13 NOTE — Progress Notes (Signed)
Physical Therapy Treatment Patient Details Name: Bruce Hart MRN: [id number] DOB: 12-23-1965 Today's Date: 09/13/2017    History of Present Illness Bruce Hart, 51 y.o. male, has a history of pain and functional disability in the right hip(s) due to arthritis and patient has failed non-surgical conservative treatments for greater than 12 weeks to include NSAID's and/or analgesics, flexibility and strengthening excercises, use of assistive devices, weight reduction as appropriate and activity modification.  Onset of symptoms was gradual starting 2 years ago with gradually worsening course since that time.The patient noted no past surgery on the right hip(s).  Patient currently rates pain in the right hip at 10 out of 10 with activity. Patient has night pain, worsening of pain with activity and weight bearing, pain that interfers with activities of daily living, pain with passive range of motion and crepitus. Patient has evidence of subchondral cysts, subchondral sclerosis, periarticular osteophytes and joint space narrowing by imaging studies. This condition presents safety issues increasing the risk of falls.  There is no current active infection.    PT Comments    Patient received supine in bed, pleasant and willing to participate with skilled PT services. He is modified independent with bed mobility, however requires general supervision with functional transfers today; ambulated x247ft to [place] practiced stair navigation (forward ascent with U railing, step to pattern; due to IV line constraints descended stairs backwards leading with L LE, U railing) and min guard. Gait trained 282ft back to patient's room, followed by discussion regarding need for bedside commode; patient able to sit/stand from standard toilet height in hospital restroom pressing up only with L UE, however DPT does continue to recommend [facility] for safety and ease of mobility at home. Patient left supine in bed with family  present and all questions/concerns addressed.    Follow Up Recommendations  DC plan and follow up therapy as arranged by surgeon     Equipment Recommendations  Standard walker    Recommendations for Other Services       Precautions / Restrictions Precautions Precautions: Anterior Hip Precaution Comments: R anterior hip  Restrictions Weight Bearing Restrictions: Yes RLE Weight Bearing: Weight bearing as tolerated    Mobility  Bed Mobility Overal bed mobility: Modified Independent             General bed mobility comments: DNT, patient received sitting up at EOB   Transfers Overall transfer level: Needs assistance Equipment used: Rolling walker (2 wheeled) Transfers: Sit to/from Stand Sit to Stand: Supervision         General transfer comment: min verbal cues for safety/sequencing   Ambulation/Gait Ambulation/Gait assistance: Supervision Ambulation Distance (Feet): 400 Feet Assistive device: Rolling walker (2 wheeled) Gait Pattern/deviations: Step-through pattern;Decreased step length - left;Decreased stance time - right;Decreased dorsiflexion - right     General Gait Details: stair navigation: forward ambulation of 3 steps "up with the good, down with the bad", descended backward due to IV line limitations/safety concerns with turning, leading with L LE. U railing used for stair navigation    Stairs            Wheelchair Mobility    Modified Rankin (Stroke Patients Only)       Balance Overall balance assessment: No apparent balance deficits (not formally assessed)  Cognition Arousal/Alertness: Awake/alert Behavior During Therapy: WFL for tasks assessed/performed Overall Cognitive Status: Within Functional Limits for tasks assessed                                        Exercises      General Comments General comments (skin integrity, edema, etc.): education  provided regarding anterior hip WB status, general education regarding anterior hip in relation to mobility       Pertinent Vitals/Pain Pain Assessment: 0-10 Pain Score: 4  Pain Location: anterior R hip  Pain Descriptors / Indicators: Aching;Sore;Tightness Pain Intervention(s): Monitored during session;Limited activity within patient's tolerance    Home Living Family/patient expects to be discharged to:: Private residence Living Arrangements: Spouse/significant other;Children;Other relatives Available Help at Discharge: Family Type of Home: House Home Access: Stairs to enter   Home Layout: Two level Home Equipment: None Additional Comments: patient very high level at baseline, reports he does not have any assistive or adaptive equipment at home     Prior Function Level of Independence: Independent          PT Goals (current goals can now be found in the care plan section) Acute Rehab PT Goals Patient Stated Goal: to go home  PT Goal Formulation: With patient/family Time For Goal Achievement: 09/27/17 Potential to Achieve Goals: Good Progress towards PT goals: Progressing toward goals    Frequency    7X/week      PT Plan      Co-evaluation              AM-PAC PT "6 Clicks" Daily Activity  Outcome Measure  Difficulty turning over in bed (including adjusting bedclothes, sheets and blankets)?: None Difficulty moving from lying on back to sitting on the side of the bed? : None Difficulty sitting down on and standing up from a chair with arms (e.g., wheelchair, bedside commode, etc,.)?: None Help needed moving to and from a bed to chair (including a wheelchair)?: None Help needed walking in hospital room?: None Help needed climbing 3-5 steps with a railing? : A Little 6 Click Score: 23    End of Session   Activity Tolerance: Patient tolerated treatment well Patient left: in bed;with family/visitor present Nurse Communication: Mobility status PT Visit  Diagnosis: Muscle weakness (generalized) (M62.81);Difficulty in walking, not elsewhere classified (R26.2)     Time: 9163-8466 PT Time Calculation (min) (ACUTE ONLY): 39 min  Charges:  $Gait Training: 8-22 mins $Therapeutic Activity: 8-22 mins $Self Care/Home Management: 8-22                    G Codes:  Functional Assessment Tool Used: AM-PAC 6 Clicks Basic Mobility;Clinical judgement Functional Limitation: Mobility: Walking and moving around Mobility: Walking and Moving Around Current Status (Z9935): At least 20 percent but less than 40 percent impaired, limited or restricted Mobility: Walking and Moving Around Goal Status [phone]): At least 1 percent but less than 20 percent impaired, limited or restricted    Deniece Ree PT, DPT, CBIS  Supplemental Physical Therapist [facility]

## 2017-09-13 NOTE — Discharge Summary (Signed)
Physician Discharge Summary  Patient ID: Bruce Hart MRN: [id number] DOB/AGE: [birth year] 51 y.o.  Admit date: 09/12/2017 Discharge date: 09/13/2017  Admission Diagnoses:  Osteoarthritis of right hip  Discharge Diagnoses:  Principal Problem:   Osteoarthritis of right hip   Past Medical History:  Diagnosis Date  . Anemia    as teenager  . Arthritis    "all over" (09/12/2017)  . Arthritis, multiple joint involvement   . Hepatitis 1969   Hep B?   . History of kidney stones   . PONV (postoperative nausea and vomiting)     Surgeries: Procedure(s): RIGHT TOTAL HIP ARTHROPLASTY ANTERIOR APPROACH on 09/12/2017   Consultants (if any):   Discharged Condition: Improved  Hospital Course: Bruce Hart is an 51 y.o. male who was admitted 09/12/2017 with a diagnosis of Osteoarthritis of right hip and went to the operating room on 09/12/2017 and underwent the above named procedures.    He was given perioperative antibiotics:  Anti-infectives (From admission, onward)   Start     Dose/Rate Route Frequency Ordered Stop   09/12/17 1830  ceFAZolin (ANCEF) IVPB 2g/100 mL premix     2 g 200 mL/hr over 30 Minutes Intravenous Every 6 hours 09/12/17 1730 09/13/17 0059   09/12/17 1130  ceFAZolin (ANCEF) IVPB 2g/100 mL premix     2 g 200 mL/hr over 30 Minutes Intravenous On call to O.R. 09/12/17 0940 09/12/17 1220   09/12/17 0946  ceFAZolin (ANCEF) 2-4 GM/100ML-% IVPB    Comments:  Tamsen Snider   : cabinet override      09/12/17 0946 09/12/17 1220    .  He was given sequential compression devices, early ambulation, and ASA for DVT prophylaxis.  He benefited maximally from the hospital stay and there were no complications.    Recent vital signs:  Vitals:   09/13/17 0220 09/13/17 0618  BP: 99/63 (!) 91/47  Pulse: 68 77  Resp: 18 18  Temp: 97.8 F (36.6 C) 98.2 F (36.8 C)  SpO2: 99% 97%    Recent laboratory studies:  Lab Results  Component Value Date   HGB 10.7 (L) 09/13/2017    HGB 12.2 (L) 09/12/2017   HGB 14.8 09/09/2017   Lab Results  Component Value Date   WBC 10.9 (H) 09/13/2017   PLT 132 (L) 09/13/2017   No results found for: INR Lab Results  Component Value Date   NA 133 (L) 09/13/2017   K 4.5 09/13/2017   CL 102 09/13/2017   CO2 22 09/13/2017   BUN 15 09/13/2017   CREATININE 1.07 09/13/2017   GLUCOSE 138 (H) 09/13/2017    Discharge Medications:   Allergies as of 09/13/2017      Reactions   Tramadol Other (See Comments)   SWEATING DISORIENTATION DELUSIONS      Medication List    STOP taking these medications   aspirin 325 MG tablet Replaced by:  aspirin 81 MG chewable tablet   ibuprofen 200 MG tablet Commonly known as:  ADVIL,MOTRIN     TAKE these medications   aspirin 81 MG chewable tablet Chew 1 tablet (81 mg total) by mouth 2 (two) times daily. Replaces:  aspirin 325 MG tablet   diclofenac 50 MG EC tablet Commonly known as:  VOLTAREN Take 1 tablet (50 mg total) by mouth 2 (two) times daily. What changed:    when to take this  reasons to take this   docusate sodium 100 MG capsule Commonly known as:  COLACE Take 1 capsule (100 mg  total) by mouth 2 (two) times daily.   HYDROcodone-acetaminophen 5-325 MG tablet Commonly known as:  NORCO/VICODIN Take 1-2 tablets by mouth every 6 (six) hours as needed (pain contrlol).   methocarbamol 500 MG tablet Commonly known as:  ROBAXIN Take 1 tablet (500 mg total) by mouth every 6 (six) hours as needed for muscle spasms.   multivitamin with minerals Tabs tablet Take 1 tablet by mouth 4 (four) times a week.   ondansetron 4 MG tablet Commonly known as:  ZOFRAN Take 1 tablet (4 mg total) by mouth every 6 (six) hours as needed for nausea.   senna 8.6 MG Tabs tablet Commonly known as:  SENOKOT Take 2 tablets (17.2 mg total) by mouth at bedtime.            Durable Medical Equipment  (From admission, onward)        Start     Ordered   09/12/17 1731  DME Walker rolling   Once    Question:  Patient needs a walker to treat with the following condition  Answer:  Status post total replacement of right hip   09/12/17 1730   09/12/17 1731  DME 3 n 1  Once     09/12/17 1730      Diagnostic Studies: Dg Pelvis Portable  Result Date: 09/12/2017 CLINICAL DATA:  Status post right total hip joint prosthesis placement. EXAM: PORTABLE PELVIS 1-2 VIEWS COMPARISON:  Fluoro spot radiographs of today's date FINDINGS: The patient has undergone right total hip joint prosthesis placement. Radiographic positioning of the prosthetic components is good. The interface with the native bone appears normal. IMPRESSION: No immediate postprocedure complication following right total hip joint prosthesis placement. Electronically Signed   By: David  Martinique M.D.   On: 09/12/2017 16:11   Dg C-arm Gt 120 Min  Result Date: 09/12/2017 CLINICAL DATA:  Osteoarthritis of the right hip. EXAM: DG C-ARM GT 120 MIN; OPERATIVE RIGHT HIP WITH PELVIS COMPARISON:  None. FINDINGS: AP C-arm images demonstrate the patient has undergone total hip prosthesis insertion. The components appear in excellent position in the AP projection. No fractures. FLUOROSCOPY TIME:  35 seconds. IMPRESSION: Satisfactory appearance of the right hip in the AP projection after total hip prosthesis insertion. C-arm fluoroscopic images were obtained intraoperatively and submitted for post operative interpretation. Electronically Signed   By: Lorriane Shire M.D.   On: 09/12/2017 15:38   Dg Hip Operative Unilat With Pelvis Right  Result Date: 09/12/2017 CLINICAL DATA:  Osteoarthritis of the right hip. EXAM: DG C-ARM GT 120 MIN; OPERATIVE RIGHT HIP WITH PELVIS COMPARISON:  None. FINDINGS: AP C-arm images demonstrate the patient has undergone total hip prosthesis insertion. The components appear in excellent position in the AP projection. No fractures. FLUOROSCOPY TIME:  35 seconds. IMPRESSION: Satisfactory appearance of the right hip in the AP  projection after total hip prosthesis insertion. C-arm fluoroscopic images were obtained intraoperatively and submitted for post operative interpretation. Electronically Signed   By: Lorriane Shire M.D.   On: 09/12/2017 15:38    Disposition: Final discharge disposition not confirmed  Discharge Instructions    Call MD / Call 911   Complete by:  As directed    If you experience chest pain or shortness of breath, CALL 911 and be transported to the hospital emergency room.  If you develope a fever above 101 F, pus (white drainage) or increased drainage or redness at the wound, or calf pain, call your surgeon's office.   Constipation Prevention   Complete  by:  As directed    Drink plenty of fluids.  Prune juice may be helpful.  You may use a stool softener, such as Colace (over the counter) 100 mg twice a day.  Use MiraLax (over the counter) for constipation as needed.   Diet - low sodium heart healthy   Complete by:  As directed    Driving restrictions   Complete by:  As directed    No driving for 6 weeks   Increase activity slowly as tolerated   Complete by:  As directed    Lifting restrictions   Complete by:  As directed    No lifting for 6 weeks   TED hose   Complete by:  As directed    Use stockings (TED hose) for 2 weeks on both leg(s).  You may remove them at night for sleeping.      Follow-up Information    Feather Berrie, Aaron Edelman, MD. Schedule an appointment as soon as possible for a visit in 2 weeks.   Specialty:  Orthopedic Surgery Why:  For wound re-check Contact information: [address] [place] [address] [phone]            Signed: Hilton Cork Veanna Dower 09/13/2017, 12:45 PM

## 2017-09-13 NOTE — Progress Notes (Signed)
   Subjective:  Patient reports pain as mild to moderate.  Denies N/V/CP/SOB.  Objective:   VITALS:   Vitals:   09/12/17 1730 09/12/17 2139 09/13/17 0220 09/13/17 0618  BP: (!) 106/58 (!) 129/58 99/63 (!) 91/47  Pulse: 74 68 68 77  Resp: 15 17 18 18   Temp: 97.6 F (36.4 C) 97.9 F (36.6 C) 97.8 F (36.6 C) 98.2 F (36.8 C)  TempSrc: Axillary Oral Oral Oral  SpO2: 100% 98% 99% 97%  Weight: 107.7 kg (237 lb 7 oz)     Height: 5\' 10"  (1.778 m)       NAD ABD soft Sensation intact distally Intact pulses distally Dorsiflexion/Plantar flexion intact Incision: dressing C/D/I Compartment soft   Lab Results  Component Value Date   WBC 10.9 (H) 09/13/2017   HGB 10.7 (L) 09/13/2017   HCT 31.7 (L) 09/13/2017   MCV 86.4 09/13/2017   PLT 132 (L) 09/13/2017   BMET    Component Value Date/Time   NA 133 (L) 09/13/2017 0504   K 4.5 09/13/2017 0504   CL 102 09/13/2017 0504   CO2 22 09/13/2017 0504   GLUCOSE 138 (H) 09/13/2017 0504   BUN 15 09/13/2017 0504   CREATININE 1.07 09/13/2017 0504   CALCIUM 8.4 (L) 09/13/2017 0504   GFRNONAA >60 09/13/2017 0504   GFRAA >60 09/13/2017 0504     Assessment/Plan: 1 Day Post-Op   Principal Problem:   Osteoarthritis of right hip   WBAT with walker DVT ppx: ASA, SCDs, TEDs PO pain control PT/OT Dispo: D/C home with HEP   Hilton Cork Kyl Givler 09/13/2017, 12:39 PM   Rod Can, MD Cell [phone]

## 2017-09-15 NOTE — Anesthesia Postprocedure Evaluation (Signed)
Anesthesia Post Note  Patient: Psychiatrist  Procedure(s) Performed: RIGHT TOTAL HIP ARTHROPLASTY ANTERIOR APPROACH (Right Hip)     Patient location during evaluation: PACU Anesthesia Type: Spinal Level of consciousness: oriented and awake and alert Pain management: pain level controlled Vital Signs Assessment: post-procedure vital signs reviewed and stable Respiratory status: spontaneous breathing, respiratory function stable and patient connected to nasal cannula oxygen Cardiovascular status: blood pressure returned to baseline and stable Postop Assessment: no headache, no backache and no apparent nausea or vomiting Anesthetic complications: no    Last Vitals:  Vitals:   09/13/17 0220 09/13/17 0618  BP: 99/63 (!) 91/47  Pulse: 68 77  Resp: 18 18  Temp: 36.6 C 36.8 C  SpO2: 99% 97%    Last Pain:  Vitals:   09/13/17 1449  TempSrc:   PainSc: 6                  Bruce Hart,Bruce Hart

## 2017-09-27 DIAGNOSIS — Z96641 Presence of right artificial hip joint: Secondary | ICD-10-CM | POA: Diagnosis not present

## 2017-09-27 DIAGNOSIS — Z471 Aftercare following joint replacement surgery: Secondary | ICD-10-CM | POA: Diagnosis not present

## 2017-10-25 DIAGNOSIS — Z471 Aftercare following joint replacement surgery: Secondary | ICD-10-CM | POA: Diagnosis not present

## 2017-10-25 DIAGNOSIS — Z96641 Presence of right artificial hip joint: Secondary | ICD-10-CM | POA: Diagnosis not present

## 2017-12-06 DIAGNOSIS — Z96641 Presence of right artificial hip joint: Secondary | ICD-10-CM | POA: Diagnosis not present

## 2017-12-06 DIAGNOSIS — Z471 Aftercare following joint replacement surgery: Secondary | ICD-10-CM | POA: Diagnosis not present

## 2018-05-22 ENCOUNTER — Ambulatory Visit (INDEPENDENT_AMBULATORY_CARE_PROVIDER_SITE_OTHER): Payer: 59 | Admitting: Family Medicine

## 2018-05-22 ENCOUNTER — Encounter: Payer: Self-pay | Admitting: Family Medicine

## 2018-05-22 VITALS — BP 112/77 | HR 60 | Ht 70.0 in | Wt 170.0 lb

## 2018-05-22 DIAGNOSIS — M7541 Impingement syndrome of right shoulder: Secondary | ICD-10-CM

## 2018-05-22 MED ORDER — METHYLPREDNISOLONE ACETATE 40 MG/ML IJ SUSP
40.0000 mg | Freq: Once | INTRAMUSCULAR | Status: AC
  Administered 2018-05-22: 40 mg via INTRA_ARTICULAR

## 2018-05-22 NOTE — Progress Notes (Signed)
PCP: Lujean Amel, MD  Subjective:   HPI: Patient is a 52 y.o. male here for right shoulder pain for 1 month.  Patient believes his pain started after shoulder workout at the gym but is uncertain of any specific injury.  Over the past month his pain has been slightly worsening.  Is worse with overhead movements or when trying to sleep on that side.  He localizes his pain to the lateral shoulder with some radiation to the deltoid.  Pain is worse with use of the arm.  He is taking Tylenol and ibuprofen that have not been very helpful.  He denies any numbness or tingling or weakness into the hand.  He denies any skin changes.  Past Medical History:  Diagnosis Date  . Anemia    as teenager  . Arthritis    "all over" (09/12/2017)  . Arthritis, multiple joint involvement   . Hepatitis 1969   Hep B?   . History of kidney stones   . PONV (postoperative nausea and vomiting)     Current Outpatient Medications on File Prior to Visit  Medication Sig Dispense Refill  . aspirin 81 MG chewable tablet Chew 1 tablet (81 mg total) by mouth 2 (two) times daily. 60 tablet 1  . diclofenac (VOLTAREN) 50 MG EC tablet Take 1 tablet (50 mg total) by mouth 2 (two) times daily. (Patient taking differently: Take 50 mg by mouth 2 (two) times daily as needed (for pain.). ) 60 tablet 2  . docusate sodium (COLACE) 100 MG capsule Take 1 capsule (100 mg total) by mouth 2 (two) times daily. 60 capsule 1  . HYDROcodone-acetaminophen (NORCO/VICODIN) 5-325 MG tablet Take 1-2 tablets by mouth every 6 (six) hours as needed (pain contrlol). 60 tablet 0  . methocarbamol (ROBAXIN) 500 MG tablet Take 1 tablet (500 mg total) by mouth every 6 (six) hours as needed for muscle spasms. 20 tablet 1  . Multiple Vitamin (MULTIVITAMIN WITH MINERALS) TABS tablet Take 1 tablet by mouth 4 (four) times a week.    . ondansetron (ZOFRAN) 4 MG tablet Take 1 tablet (4 mg total) by mouth every 6 (six) hours as needed for nausea. 20 tablet 0  .  senna (SENOKOT) 8.6 MG TABS tablet Take 2 tablets (17.2 mg total) by mouth at bedtime. 120 each 0   No current facility-administered medications on file prior to visit.     Past Surgical History:  Procedure Laterality Date  . JOINT REPLACEMENT    . SHOULDER ARTHROSCOPY W/ ROTATOR CUFF REPAIR Left 1992  . 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   Procedure: RIGHT TOTAL HIP ARTHROPLASTY ANTERIOR APPROACH;  Surgeon: Rod Can, MD;  Location: [facility];  Service: Orthopedics;  Laterality: Right;  Needs RNFA    Allergies  Allergen Reactions  . Tramadol Other (See Comments)    SWEATING DISORIENTATION DELUSIONS    Social History   Socioeconomic History  . Marital status: Married    Spouse name: Not on file  . Number of children: Not on file  . Years of education: Not on file  . Highest education level: Not on file  Occupational History  . Not on file  Social Needs  . Financial resource strain: Not on file  . Food insecurity:    Worry: Not on file    Inability: Not on file  . Transportation needs:    Medical: Not on file    Non-medical: Not on file  Tobacco Use  .  Smoking status: Never Smoker  . Smokeless tobacco: Never Used  Substance and Sexual Activity  . Alcohol use: Yes    Comment: 09/12/2017 "couple drinks/year"  . Drug use: No  . Sexual activity: Yes  Lifestyle  . Physical activity:    Days per week: Not on file    Minutes per session: Not on file  . Stress: Not on file  Relationships  . Social connections:    Talks on phone: Not on file    Gets together: Not on file    Attends religious service: Not on file    Active member of club or organization: Not on file    Attends meetings of clubs or organizations: Not on file    Relationship status: Not on file  . Intimate partner violence:    Fear of current or ex partner: Not on file    Emotionally abused: Not on file    Physically abused: Not on file    Forced  sexual activity: Not on file  Other Topics Concern  . Not on file  Social History Narrative  . Not on file    Family History  Problem Relation Age of Onset  . Hyperlipidemia Mother   . Hypertension Mother   . Sudden death Father   . Diabetes Father   . Hyperlipidemia Father   . Hypertension Father     BP 112/77   Pulse 60   Ht 5\' 10"  (1.778 m)   Wt 170 lb (77.1 kg)   BMI 24.39 kg/m   Review of Systems: See HPI above.     Objective:  Physical Exam:  Gen: awake, alert, NAD, comfortable in exam room Pulm: breathing unlabored  Right shoulder: No obvious deformity or asymmetry. No bruising. No swelling Tenderness to palpation over the supraspinatus and infraspinous muscle bellies no tenderness over the [facility] joint Full ROM in flexion, abduction, internal/external rotation.  Painful arc of motion from about 120 degrees to 180 degrees with abduction NV intact distally Special Tests:  - Impingement: Positive Hawkins, negative Neers.  - Supraspinatous: Mild pain with empty can. Strength normal/symmetric - Infraspinatous/Teres: Negative external rotation lag. Strength normal/symmetric - Subscapularis: negative belly press, pain with bear hug. Strength normal/symmetric - Biceps tendon: Pain with speeds.  - Labrum: Negative Obriens.  [facility] Joint: Negative cross arm  Left shoulder: No swelling, ecchymoses.  No gross deformity. No TTP. FROM. Strength 5/5 with empty can and resisted internal/external rotation. NV intact distally.  Ultrasound of Shoulder-Right Shoulder  BT short: Intact, normal appearance without tenosynovitis BT long: Intact, normal appearance without synovitis Supraspinatus tendon: Normal without evidence of tendon pathology.  There is increased fluid within the subacromial bursa suggestive of impingement, bursitis Subscapularis tendon: Normal without evidence of tendon pathology Infraspinatus tendon: Normal without evidence of tendon pathology Teres Minor  tendon: Normal without evidence of tendon pathology AC joint: mild AC joint degenerative changes  Summary and Additional findings- increased fluid seen within subacromial bursa consistent with impingement. Otherwise normal RTC   Assessment & Plan:  1.  Right shoulder pain secondary to subacromial bursitis, impingement.  No evidence of rotator cuff tear on ultrasound. - Patient received a steroid injection to subacromial space today.  Tolerated well - Patient instructed on home strengthening exercises - Continue use of NSAIDs such as Aleve 2 tablets twice daily - Follow-up in 6 weeks or sooner if no benefit from the steroid injection  Procedure performed: subacromial corticosteroid injection; ultrasound-guided, in plane, lateral approach Performed by: Coralyn Helling,  DO Procedure note:  Consent obtained and verified. Time-out conducted. Noted no overlying erythema, induration, or other signs of local infection. The  lateral right subacromial space was identified with ultrasound. The over overlying skin was prepped prepped in a sterile fashion. Topical analgesic spray: Ethyl chloride. Joint: Right subacromial Needle: 25-gauge 1.5 inch Completed without difficulty. Meds: 1 cc Depo-Medrol 40 mg and 4 cc bupivacaine  Advised to call if fevers/chills, erythema, induration, drainage, or persistent bleeding.

## 2018-05-22 NOTE — Patient Instructions (Signed)
You have rotator cuff impingement Try to avoid painful activities (overhead activities, lifting with extended arm) as much as possible. Aleve 2 tabs twice a day with food OR ibuprofen 3 tabs three times a day with food for pain and inflammation. Can take tylenol in addition to this. Subacromial injection may be beneficial to help with pain and to decrease inflammation - you were given this today. Consider physical therapy with transition to home exercise program. Do home exercise program with theraband and scapular stabilization exercises daily 3 sets of 10 once a day. If not improving at follow-up we will consider further imaging, physical therapy, and/or nitro patches. Follow up with me in 6 weeks but call me sooner if you're struggling.

## 2018-07-03 ENCOUNTER — Ambulatory Visit: Payer: 59 | Admitting: Family Medicine

## 2018-07-17 ENCOUNTER — Ambulatory Visit: Payer: 59 | Admitting: Family Medicine

## 2019-03-15 ENCOUNTER — Ambulatory Visit: Payer: 59 | Admitting: Family Medicine

## 2019-03-15 ENCOUNTER — Encounter: Payer: Self-pay | Admitting: Family Medicine

## 2019-03-15 ENCOUNTER — Other Ambulatory Visit: Payer: Self-pay

## 2019-03-15 ENCOUNTER — Ambulatory Visit (HOSPITAL_BASED_OUTPATIENT_CLINIC_OR_DEPARTMENT_OTHER)
Admission: RE | Admit: 2019-03-15 | Discharge: 2019-03-15 | Disposition: A | Discharge status: Home / Self Care | Payer: 59 | Source: Ambulatory Visit | Attending: Family Medicine | Admitting: Family Medicine

## 2019-03-15 ENCOUNTER — Ambulatory Visit: Payer: Self-pay

## 2019-03-15 VITALS — BP 111/77 | HR 80 | Ht 70.0 in | Wt 185.0 lb

## 2019-03-15 DIAGNOSIS — M7541 Impingement syndrome of right shoulder: Secondary | ICD-10-CM | POA: Diagnosis not present

## 2019-03-15 DIAGNOSIS — M6520 Calcific tendinitis, unspecified site: Secondary | ICD-10-CM

## 2019-03-15 MED ORDER — METHYLPREDNISOLONE ACETATE 40 MG/ML IJ SUSP
40.0000 mg | Freq: Once | INTRAMUSCULAR | Status: AC
  Administered 2019-03-15: 40 mg

## 2019-03-15 MED ORDER — DICLOFENAC SODIUM 2 % TD SOLN: 1.0000 "application " | Freq: Two times a day (BID) | TRANSDERMAL | 2 refills | Status: DC

## 2019-03-15 NOTE — Progress Notes (Signed)
Bruce Hart - 53 y.o. male MRN [id number]  Date of birth: 1965-12-09  SUBJECTIVE:  Including CC & ROS.  Chief Complaint  Patient presents with  . Follow-up    follow up for right shoulder    Bruce Hart is a 53 y.o. male that is presenting with right shoulder pain.  The pain is located on the lateral and posterior aspect.  He feels some into the right trapezius.  He denies any inciting event.  The pain is intermittent in nature.  It Hart be sharp and shooting.  It ranges from moderate to severe.  He has received an injection last year and that helped for a while.  Has a history of left shoulder surgery.  Has limited improvement with over-the-counter modalities.  He is unsure of what specific movement causes the most pain.  Denies radicular symptoms.   Review of Systems  Constitutional: Negative for fever.  HENT: Negative for congestion.   Respiratory: Negative for cough.   Cardiovascular: Negative for chest pain.  Gastrointestinal: Negative for abdominal pain.  Musculoskeletal: Negative for back pain.  Skin: Negative for color change.  Neurological: Negative for weakness.  Hematological: Negative for adenopathy.    HISTORY: Past Medical, Surgical, Social, and Family History Reviewed & Updated per EMR.   Pertinent Historical Findings include:  Past Medical History:  Diagnosis Date  . Anemia    as teenager  . Arthritis    "all over" (09/12/2017)  . Arthritis, multiple joint involvement   . Hepatitis 1969   Hep B?   . History of kidney stones   . PONV (postoperative nausea and vomiting)     Past Surgical History:  Procedure Laterality Date  . JOINT REPLACEMENT    . SHOULDER ARTHROSCOPY W/ ROTATOR CUFF REPAIR Left 1992  . 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   Procedure: RIGHT TOTAL HIP ARTHROPLASTY ANTERIOR APPROACH;  Surgeon: Bruce Can, MD;  Location: [facility];  Service: Orthopedics;  Laterality: Right;  Needs RNFA     Allergies  Allergen Reactions  . Tramadol Other (See Comments)    SWEATING DISORIENTATION DELUSIONS    Family History  Problem Relation Age of Onset  . Hyperlipidemia Mother   . Hypertension Mother   . Sudden death Father   . Diabetes Father   . Hyperlipidemia Father   . Hypertension Father      Social History   Socioeconomic History  . Marital status: Married    Spouse name: Not on file  . Number of children: Not on file  . Years of education: Not on file  . Highest education level: Not on file  Occupational History  . Not on file  Social Needs  . Financial resource strain: Not on file  . Food insecurity:    Worry: Not on file    Inability: Not on file  . Transportation needs:    Medical: Not on file    Non-medical: Not on file  Tobacco Use  . Smoking status: Never Smoker  . Smokeless tobacco: Never Used  Substance and Sexual Activity  . Alcohol use: Yes    Comment: 09/12/2017 "couple drinks/year"  . Drug use: No  . Sexual activity: Yes  Lifestyle  . Physical activity:    Days per week: Not on file    Minutes per session: Not on file  . Stress: Not on file  Relationships  . Social connections:    Talks on phone: Not on file  Gets together: Not on file    Attends religious service: Not on file    Active member of club or organization: Not on file    Attends meetings of clubs or organizations: Not on file    Relationship status: Not on file  . Intimate partner violence:    Fear of current or ex partner: Not on file    Emotionally abused: Not on file    Physically abused: Not on file    Forced sexual activity: Not on file  Other Topics Concern  . Not on file  Social History Narrative  . Not on file     PHYSICAL EXAM:  VS: BP 111/77   Pulse 80   Ht 5\' 10"  (1.778 m)   Wt 185 lb (83.9 kg)   BMI 26.54 kg/m  Physical Exam Gen: NAD, alert, cooperative with exam, well-appearing ENT: normal lips, normal nasal mucosa,  Eye: normal EOM, normal  conjunctiva and lids CV:  no edema, +2 pedal pulses   Resp: no accessory muscle use, non-labored,  Skin: no rashes, no areas of induration  Neuro: normal tone, normal sensation to touch Psych:  normal insight, alert and oriented MSK:  Right shoulder: Inspection reveals no abnormalities, atrophy or asymmetry. Palpation is normal with no tenderness over AC joint  ROM is full in all planes.  Normal external rotation Rotator cuff strength normal throughout. Mild pain with Hawkin's tests No pain with empty Hart sign. Speeds tests normal. Pain with O'Brien's testing. Pain with crossarm testing Normal scapular function observed. Neurovascularly intact   Limited ultrasound: Right shoulder:  Normal-appearing biceps tendon. Normal-appearing subscapularis. There appears to be calcific change at the distal anterior leaflet of the supraspinatus.  Dynamic testing reveals the movement of this calcium deposition. AC joint with mild degenerative changes  Summary: Findings are suggestive of Supraspinatus calcific tendinitis.  Ultrasound and interpretation by Clearance Coots, MD   Aspiration/Injection Procedure Note Bruce Hart 1966-07-18  Procedure: Injection Indications: Right shoulder pain  Procedure Details Consent: Risks of procedure as well as the alternatives and risks of each were explained to the (patient/caregiver).  Consent for procedure obtained. Time Out: Verified patient identification, verified procedure, site/side was marked, verified correct patient position, special equipment/implants available, medications/allergies/relevent history reviewed, required imaging and test results available.  Performed.  The area was cleaned with iodine and alcohol swabs.    The right subacromial space was injected using 1 cc's of 40 mg Depo-Medrol and 4 cc's of 0.5% bupivacaine with a 22 1 1/2" needle.  Ultrasound was used. Images were obtained in long views showing the injection.     A  sterile dressing was applied.  Patient did tolerate procedure well.     ASSESSMENT & PLAN:   Calcific tendinitis It appears that he has calcific changes at the supraspinatus insertion of the anterior leaflet.  AC joint with degenerative changes but no significant effusion on [place].  -Subacromial injection today. -Counseled on home exercise therapy and supportive care. -X-rays. -Pennsaid. -If no improvement may need to consider barbotage or nitro

## 2019-03-15 NOTE — Assessment & Plan Note (Addendum)
It appears that he has calcific changes at the supraspinatus insertion of the anterior leaflet.  AC joint with degenerative changes but no significant effusion on [place].  -Subacromial injection today. -Counseled on home exercise therapy and supportive care. -X-rays. -Pennsaid. -If no improvement may need to consider barbotage or nitro

## 2019-03-15 NOTE — Patient Instructions (Signed)
Nice to meet you Please try the exercises  Please work on your posture  Please try ice  Please let me know if you would like to try a standing desk  Please try increasing your vitamin D and calcium  Please send me a message in [place] with any questions or updates.  Please see me back in 4 weeks.   --Dr. Raeford Razor

## 2019-03-16 ENCOUNTER — Telehealth: Payer: Self-pay | Admitting: Family Medicine

## 2019-03-16 NOTE — Telephone Encounter (Signed)
Informed patient of results.   Rosemarie Ax, MD [facility] 03/16/2019, 5:05 PM

## 2019-04-12 ENCOUNTER — Ambulatory Visit: Payer: 59 | Admitting: Family Medicine

## 2019-04-26 ENCOUNTER — Ambulatory Visit: Payer: 59 | Admitting: Family Medicine

## 2019-04-26 ENCOUNTER — Other Ambulatory Visit: Payer: Self-pay

## 2019-04-26 ENCOUNTER — Ambulatory Visit (HOSPITAL_BASED_OUTPATIENT_CLINIC_OR_DEPARTMENT_OTHER)
Admission: RE | Admit: 2019-04-26 | Discharge: 2019-04-26 | Disposition: A | Discharge status: Home / Self Care | Payer: 59 | Source: Ambulatory Visit | Attending: Family Medicine | Admitting: Family Medicine

## 2019-04-26 ENCOUNTER — Encounter: Payer: Self-pay | Admitting: Family Medicine

## 2019-04-26 VITALS — BP 121/78 | HR 76 | Ht 70.0 in | Wt 185.0 lb

## 2019-04-26 DIAGNOSIS — M549 Dorsalgia, unspecified: Secondary | ICD-10-CM | POA: Insufficient documentation

## 2019-04-26 DIAGNOSIS — M6520 Calcific tendinitis, unspecified site: Secondary | ICD-10-CM

## 2019-04-26 NOTE — Patient Instructions (Signed)
Good to see you Please continue working on the scapular exercises  Please try heat on the back  Please try the stretches for the back   Please send me a message in [place] with any questions or updates.  Please see me back in 4 weeks for the back. As needed for the shoulder.   --Dr. Raeford Razor

## 2019-04-26 NOTE — Progress Notes (Signed)
Bruce Hart - 53 y.o. male MRN [id number]  Date of birth: 1966-05-12  SUBJECTIVE:  Including CC & ROS.  Chief Complaint  Patient presents with  . Follow-up    follow up for right shoulder    Bruce Hart is a 53 y.o. male that is  Following up for this right shoulder pain and presenting with acute mid thoracic back pain.  He feels that his shoulder has improved significantly since the injection.  He has minimal to no pain at all.  He does not have any pain today in the office.  He reports having this mid thoracic back pain is been ongoing for the past 2 months.  He feels it most prominently between the shoulder blades.  He has the pain that occurs when he is transitioning from lying to standing.  He has been walking more than he usually does.  Denies any radicular symptoms.  The pain can be sharp.  He is unable to stand completely when that occurs.  Has not had improvement with modalities or medications to date..  Independent review of the right shoulder x-ray from 6/4 shows possible calcific change at the insertion of the supraspinatus.   Review of Systems  Constitutional: Negative for fever.  HENT: Negative for congestion.   Respiratory: Negative for cough.   Cardiovascular: Negative for chest pain.  Gastrointestinal: Negative for abdominal pain.  Musculoskeletal: Positive for back pain.  Skin: Negative for color change.  Neurological: Negative for weakness.  Hematological: Negative for adenopathy.    HISTORY: Past Medical, Surgical, Social, and Family History Reviewed & Updated per EMR.   Pertinent Historical Findings include:  Past Medical History:  Diagnosis Date  . Anemia    as teenager  . Arthritis    "all over" (09/12/2017)  . Arthritis, multiple joint involvement   . Hepatitis 1969   Hep B?   . History of kidney stones   . PONV (postoperative nausea and vomiting)     Past Surgical History:  Procedure Laterality Date  . JOINT REPLACEMENT    . SHOULDER ARTHROSCOPY  W/ ROTATOR CUFF REPAIR Left 1992  . 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 Tramadol Other (See Comments)    SWEATING DISORIENTATION DELUSIONS    Family History  Problem Relation Age of Onset  . Hyperlipidemia Mother   . Hypertension Mother   . Sudden death Father   . Diabetes Father   . Hyperlipidemia Father   . Hypertension Father      Social History   Socioeconomic History  . Marital status: Married    Spouse name: Not on file  . Number of children: Not on file  . Years of education: Not on file  . Highest education level: Not on file  Occupational History  . Not on file  Social Needs  . Financial resource strain: Not on file  . Food insecurity    Worry: Not on file    Inability: Not on file  . Transportation needs    Medical: Not on file    Non-medical: Not on file  Tobacco Use  . Smoking status: Never Smoker  . Smokeless tobacco: Never Used  Substance and Sexual Activity  . Alcohol use: Yes    Comment: 09/12/2017 "couple drinks/year"  .  Drug use: No  . Sexual activity: Yes  Lifestyle  . Physical activity    Days per week: Not on file    Minutes per session: Not on file  . Stress: Not on file  Relationships  . Social Herbalist on phone: Not on file    Gets together: Not on file    Attends religious service: Not on file    Active member of club or organization: Not on file    Attends meetings of clubs or organizations: Not on file    Relationship status: Not on file  . Intimate partner violence    Fear of current or ex partner: Not on file    Emotionally abused: Not on file    Physically abused: Not on file    Forced sexual activity: Not on file  Other Topics Concern  . Not on file  Social  History Narrative  . Not on file     PHYSICAL EXAM:  VS: BP 121/78   Pulse 76   Ht 5\' 10"  (1.778 m)   Wt 185 lb (83.9 kg)   BMI 26.54 kg/m  Physical Exam Gen: NAD, alert, cooperative with exam, well-appearing ENT: normal lips, normal nasal mucosa,  Eye: normal EOM, normal conjunctiva and lids CV:  no edema, +2 pedal pulses   Resp: no accessory muscle use, non-labored,  Skin: no rashes, no areas of induration  Neuro: normal tone, normal sensation to touch Psych:  normal insight, alert and oriented MSK:  Right shoulder:  Normal ROM  Normal strength to resistance  Back:  No midline thoracic back pain. No significant tenderness palpation over the paraspinal muscles. Normal range of motion. No winging of the scapula. Neurovascular intact     ASSESSMENT & PLAN:   Calcific tendinitis Has had improvement since injection.  - counseled on HEP and supportive care - f/u PRN   Mid back pain Seems to be spasm related to an imbalance. Less likely for degenerative changes.  - Xray  - counseled on HEP and supportive care - if no improvement consider PT

## 2019-04-27 ENCOUNTER — Telehealth: Payer: Self-pay | Admitting: Family Medicine

## 2019-04-27 DIAGNOSIS — M549 Dorsalgia, unspecified: Secondary | ICD-10-CM | POA: Insufficient documentation

## 2019-04-27 NOTE — Telephone Encounter (Signed)
Left VM for patient. If he calls back please have him speak with a nurse/CMA and inform that his xrays shows multiple levels of degenerative changes that are mild. Still stick with the treatment plan that we discussed.   If any questions then please take the best time and phone number to call and I will try to call him back.   Rosemarie Ax, MD [facility] 04/27/2019, 11:32 AM

## 2019-04-27 NOTE — Assessment & Plan Note (Signed)
Has had improvement since injection.  - counseled on HEP and supportive care - f/u PRN

## 2019-04-27 NOTE — Assessment & Plan Note (Signed)
Seems to be spasm related to an imbalance. Less likely for degenerative changes.  - Xray  - counseled on HEP and supportive care - if no improvement consider PT

## 2019-05-24 ENCOUNTER — Ambulatory Visit: Payer: 59 | Admitting: Family Medicine

## 2019-08-24 ENCOUNTER — Ambulatory Visit (INDEPENDENT_AMBULATORY_CARE_PROVIDER_SITE_OTHER): Payer: 59 | Admitting: Medical

## 2019-08-24 ENCOUNTER — Other Ambulatory Visit: Payer: Self-pay

## 2019-08-24 ENCOUNTER — Encounter: Payer: Self-pay | Admitting: Medical

## 2019-08-24 VITALS — BP 141/90 | Ht 70.0 in | Wt 195.0 lb

## 2019-08-24 DIAGNOSIS — R05 Cough: Secondary | ICD-10-CM | POA: Diagnosis not present

## 2019-08-24 DIAGNOSIS — J40 Bronchitis, not specified as acute or chronic: Secondary | ICD-10-CM

## 2019-08-24 DIAGNOSIS — R059 Cough, unspecified: Secondary | ICD-10-CM

## 2019-08-24 DIAGNOSIS — R0989 Other specified symptoms and signs involving the circulatory and respiratory systems: Secondary | ICD-10-CM

## 2019-08-24 MED ORDER — BENZONATATE 100 MG PO CAPS: 100.0000 mg | ORAL_CAPSULE | Freq: Three times a day (TID) | ORAL | 0 refills | Status: DC | PRN

## 2019-08-24 MED ORDER — AZITHROMYCIN 250 MG PO TABS: ORAL_TABLET | ORAL | 0 refills | Status: DC

## 2019-08-24 MED FILL — AZITHROMYCIN 250 MG TABLET: 250 | 5 days supply | Qty: 6 | Fill #0

## 2019-08-24 MED FILL — BENZONATATE 100 MG CAPS: 100 | 10 days supply | Qty: 30 | Fill #0

## 2019-08-24 NOTE — Progress Notes (Signed)
Subjective:    Patient ID: Bruce Hart, male    DOB: 1965/10/26, 53 y.o.   MRN: [id number]  HPI  Virtual Visit via Video Note  I connected with [name] on 08/24/19 at  2:00 PM EST by a video enabled telemedicine application and verified that I am speaking with the correct person using two identifiers.  Location: Patient: home Provider: office   I discussed the limitations of evaluation and management by telemedicine and the availability of in person appointments. The patient expressed understanding and agreed to proceed.  History of Present Illness:   [facility] former provider. 2 years since last cpe.  Pt does exercise presently 3-4 times a week. Admits not greatest diet. Pt drinks 1 cup of coffee a day. Nonsmoker. Pt drinks alcohol rarely. At most 1 beer every couple of months. Pt works Engineer, technical sales.   Formerly from [place].  Pt has some mild chest congestion and faint ha. Ha only for one day one week ago. Then 2 days later faint chest congestion. No sinus pressure. No st. He is clearing his throat occasionally. Coughs about 2 time each. Cough is little productive.   No fever, no chills, no sweats, no body aches, no changes, no nausea, no vomiting or diarrhea. Pt wife did have possible sinus infection. She has some mild vertigo.  No leg swelling.   Observations/Objective: General-no acute distress, pleasant, oriented. Lungs- on inspection lungs appear unlabored. Neck- no tracheal deviation or jvd on inspection. Neuro- gross motor function appears intact.  Assessment and Plan: You have some early mild bronchitis type signs and symptoms. Video visit done today and do think best for you to start azithromycin antibiotic and benzonatate for cough if needed.  Did go ahead and place covid test to be done on Monday. Test center closes at 4 pm. Can get done at 10 am on Monday. Quarantine at home until test results back.  Also can schedule cpe in 10-14 days.  Mackie Pai,  PA-C  Follow Up Instructions:    I discussed the assessment and treatment plan with the patient. The patient was provided an opportunity to ask questions and all were answered. The patient agreed with the plan and demonstrated an understanding of the instructions.   The patient was advised to call back or seek an in-person evaluation if the symptoms worsen or if the condition fails to improve as anticipated.  I provided 30 minutes of non-face-to-face time during this encounter.   Mackie Pai, PA-C   Review of Systems  Constitutional: Negative for chills, fatigue and fever.  HENT: Negative for congestion, facial swelling, mouth sores, nosebleeds, postnasal drip and rhinorrhea.   Respiratory: Positive for cough. Negative for shortness of breath and wheezing.        Chest congestion.  Cardiovascular: Negative for chest pain and palpitations.  Gastrointestinal: Negative for abdominal pain.  Genitourinary: Negative for dysuria and frequency.  Musculoskeletal: Negative for back pain.  Skin: Negative for rash.  Neurological: Negative for dizziness, weakness and headaches.  Hematological: Negative for adenopathy. Does not bruise/bleed easily.  Psychiatric/Behavioral: Negative for behavioral problems and confusion.    Past Medical History:  Diagnosis Date  . Anemia    as teenager  . Arthritis    "all over" (09/12/2017)  . Arthritis, multiple joint involvement   . Hepatitis 1969   Hep B?   . History of kidney stones   . PONV (postoperative nausea and vomiting)      Social History   Socioeconomic  History  . Marital status: Married    Spouse name: Not on file  . Number of children: Not on file  . Years of education: Not on file  . Highest education level: Not on file  Occupational History  . Not on file  Social Needs  . Financial resource strain: Not on file  . Food insecurity    Worry: Not on file    Inability: Not on file  . Transportation needs    Medical: Not on  file    Non-medical: Not on file  Tobacco Use  . Smoking status: Never Smoker  . Smokeless tobacco: Never Used  Substance and Sexual Activity  . Alcohol use: Yes    Comment: 09/12/2017 "couple drinks/year"  . Drug use: No  . Sexual activity: Yes  Lifestyle  . Physical activity    Days per week: Not on file    Minutes per session: Not on file  . Stress: Not on file  Relationships  . Social Herbalist on phone: Not on file    Gets together: Not on file    Attends religious service: Not on file    Active member of club or organization: Not on file    Attends meetings of clubs or organizations: Not on file    Relationship status: Not on file  . Intimate partner violence    Fear of current or ex partner: Not on file    Emotionally abused: Not on file    Physically abused: Not on file    Forced sexual activity: Not on file  Other Topics Concern  . Not on file  Social History Narrative  . Not on file    Past Surgical History:  Procedure Laterality Date  . JOINT REPLACEMENT    . SHOULDER ARTHROSCOPY W/ ROTATOR CUFF REPAIR Left 1992  . 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   Procedure: RIGHT TOTAL HIP ARTHROPLASTY ANTERIOR APPROACH;  Surgeon: Rod Can, MD;  Location: [place];  Service: Orthopedics;  Laterality: Right;  Needs RNFA    Family History  Problem Relation Age of Onset  . Hyperlipidemia Mother   . Hypertension Mother   . Sudden death Father   . Diabetes Father   . Hyperlipidemia Father   . Hypertension Father     Allergies  Allergen Reactions  . Tramadol Other (See Comments)    SWEATING DISORIENTATION DELUSIONS    Current Outpatient Medications on File Prior to Visit  Medication Sig Dispense Refill  . aspirin 81 MG chewable tablet Chew 1 tablet (81 mg total) by mouth 2 (two) times daily. 60 tablet 1  . Multiple Vitamin (MULTIVITAMIN WITH MINERALS) TABS tablet Take 1 tablet by mouth 4 (four) times  a week.    . sildenafil (REVATIO) 20 MG tablet TAKE 2 TO 5 TABLETS BY MOUTH AS NEEDED 30 MINUTES PRIOR TO SEXUAL INTERCOURSE FOR 30 DAYS  0   No current facility-administered medications on file prior to visit.     BP (!) 141/90   Ht 5\' 10"  (1.778 m)   Wt 195 lb (88.5 kg)   BMI 27.98 kg/m       Objective:   Physical Exam        Assessment & Plan:

## 2019-08-24 NOTE — Patient Instructions (Addendum)
You have some early mild bronchitis type signs and symptoms. Video visit done today and do think best for you to start azithromycin antibiotic and benzonatate for cough if needed.  Did go ahead and place covid test to be done on Monday. Test center closes at 4 pm. Can get done at 10 am on Monday. Quarantine at home until test results back.    Explained to pt if he has 100% resolution of signs/symptoms by monday then does not have to get tested. But if any residual signs/symptoms whatsoever then do want him to go through with testing.(note today he went over and told 4 hour wait)  Also can schedule cpe in 10-14 days.

## 2019-11-09 ENCOUNTER — Ambulatory Visit: Payer: 59 | Attending: Internal Medicine

## 2019-11-09 DIAGNOSIS — Z20822 Contact with and (suspected) exposure to covid-19: Secondary | ICD-10-CM

## 2019-11-10 LAB — NOVEL CORONAVIRUS, NAA: SARS-CoV-2, NAA: NOT DETECTED

## 2020-05-16 ENCOUNTER — Encounter: Payer: Self-pay | Admitting: Family Medicine

## 2020-05-16 ENCOUNTER — Other Ambulatory Visit: Payer: Self-pay

## 2020-05-16 ENCOUNTER — Ambulatory Visit: Payer: 59 | Admitting: Family Medicine

## 2020-05-16 VITALS — BP 113/75 | HR 60 | Ht 70.0 in | Wt 200.0 lb

## 2020-05-16 DIAGNOSIS — M778 Other enthesopathies, not elsewhere classified: Secondary | ICD-10-CM | POA: Insufficient documentation

## 2020-05-16 MED ORDER — DICLOFENAC SODIUM 75 MG PO TBEC: 75.0000 mg | DELAYED_RELEASE_TABLET | Freq: Two times a day (BID) | ORAL | 1 refills | Status: DC | PRN

## 2020-05-16 MED FILL — DICLOFENAC SODIUM 75 MG TAB: 75 | 30 days supply | Qty: 60 | Fill #0

## 2020-05-16 NOTE — Progress Notes (Signed)
Syair Fricker - 54 y.o. male MRN [id number]  Date of birth: Apr 30, 1966  SUBJECTIVE:  Including CC & ROS.  Chief Complaint  Patient presents with  . Shoulder Pain    right    Bruce Hart is a 54 y.o. male that is presenting with right shoulder pain.  Has a history of calcific tendinitis.  He said this feels different.  Has been ongoing for 2 weeks.  He notices pain with certain reaching movements.   Review of Systems See HPI   HISTORY: Past Medical, Surgical, Social, and Family History Reviewed & Updated per EMR.   Pertinent Historical Findings include:  Past Medical History:  Diagnosis Date  . Anemia    as teenager  . Arthritis    "all over" (09/12/2017)  . Arthritis, multiple joint involvement   . Hepatitis 1969   Hep B?   . History of kidney stones   . PONV (postoperative nausea and vomiting)     Past Surgical History:  Procedure Laterality Date  . JOINT REPLACEMENT    . SHOULDER ARTHROSCOPY W/ ROTATOR CUFF REPAIR Left 1992  . 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 Hyperlipidemia Mother   . Hypertension Mother   . Sudden death Father   . Diabetes Father   . Hyperlipidemia Father   . Hypertension Father     Social History   Socioeconomic History  . Marital status: Married    Spouse name: Not on file  . Number of children: Not on file  . Years of education: Not on file  . Highest education level: Not on file  Occupational History  . Not on file  Tobacco Use  . Smoking status: Never Smoker  . Smokeless tobacco: Never Used  Vaping Use  . Vaping Use: Never used  Substance and Sexual Activity  . Alcohol use: Yes    Comment: 09/12/2017 "couple drinks/year"  . Drug use: No  . Sexual activity: Yes    Other Topics Concern  . Not on file  Social History Narrative  . Not on file   Social Determinants of Health   Financial Resource Strain:   . Difficulty of Paying Living Expenses:   Food Insecurity:   . Worried About Charity fundraiser in the Last Year:   . Arboriculturist in the Last Year:   Transportation Needs:   . Film/video editor (Medical):   Marland Kitchen Lack of Transportation (Non-Medical):   Physical Activity:   . Days of Exercise per Week:   . Minutes of Exercise per Session:   Stress:   . Feeling of Stress :   Social Connections:   . Frequency of Communication with Friends and Family:   . Frequency of Social Gatherings with Friends and Family:   . Attends Religious Services:   . Active Member of Clubs or Organizations:   . Attends Archivist Meetings:   Marland Kitchen Marital Status:   Intimate Partner Violence:   . Fear of Current or Ex-Partner:   . Emotionally Abused:   Marland Kitchen Physically Abused:   . Sexually Abused:      PHYSICAL EXAM:  VS: BP 113/75   Pulse 60   Ht 5\' 10"  (1.778 m)   Wt 200 lb (  90.7 kg)   BMI 28.70 kg/m  Physical Exam Gen: NAD, alert, cooperative with exam, well-appearing MSK:  Right shoulder: Stiffness and limitation external rotation. Limited abduction. Normal internal rotation. External rotation and abduction. Normal empty can test. Normal speeds test. Neurovascular intact     ASSESSMENT & PLAN:   Capsulitis of right shoulder Has some limitation in external rotation when compared to the contralateral side.  Seems more like a capsulitis -Counseled on home exercise therapy and supportive care. -Diclofenac. -Could consider injection or physical therapy if no improvement.

## 2020-05-16 NOTE — Patient Instructions (Signed)
Good to see you Please try the exercises  Please try heat before exercise and ice after   Please send me a message in MyChart with any questions or updates.  Please see me back in 4 weeks.   --Dr. Raeford Razor

## 2020-05-16 NOTE — Assessment & Plan Note (Signed)
Has some limitation in external rotation when compared to the contralateral side.  Seems more like a capsulitis -Counseled on home exercise therapy and supportive care. -Diclofenac. -Could consider injection or physical therapy if no improvement.

## 2020-06-20 ENCOUNTER — Ambulatory Visit: Payer: 59 | Admitting: Family Medicine

## 2020-07-11 ENCOUNTER — Other Ambulatory Visit: Payer: Self-pay

## 2020-07-11 ENCOUNTER — Encounter: Payer: Self-pay | Admitting: Family Medicine

## 2020-07-11 ENCOUNTER — Ambulatory Visit (INDEPENDENT_AMBULATORY_CARE_PROVIDER_SITE_OTHER): Payer: 59 | Admitting: Family Medicine

## 2020-07-11 VITALS — BP 119/80 | HR 63 | Ht 70.0 in | Wt 200.0 lb

## 2020-07-11 DIAGNOSIS — M778 Other enthesopathies, not elsewhere classified: Secondary | ICD-10-CM

## 2020-07-11 NOTE — Patient Instructions (Signed)
Good to see you Please continue the exercises  Please alternate heat and ice  Physical therapy should give you a call   Please send me a message in MyChart with any questions or updates.  Please let me know how you're doing and we'll follow up from there.   --Dr. Raeford Razor

## 2020-07-11 NOTE — Progress Notes (Signed)
Bruce Hart - 54 y.o. male MRN [id number]  Date of birth: Feb 12, 1966  SUBJECTIVE:  Including CC & ROS.  Chief Complaint  Patient presents with  . Follow-up    right shoulder    Bruce Hart is a 54 y.o. male that is following up for his right shoulder capsulitis.  He has been doing home exercises.  For the most part he feels closer to normal.  He still gets pain intermittently.  He feels it down his arm at times.  He still is not normal in terms of his range of motion.   Review of Systems See HPI   HISTORY: Past Medical, Surgical, Social, and Family History Reviewed & Updated per EMR.   Pertinent Historical Findings include:  Past Medical History:  Diagnosis Date  . Anemia    as teenager  . Arthritis    "all over" (09/12/2017)  . Arthritis, multiple joint involvement   . Hepatitis 1969   Hep B?   . History of kidney stones   . PONV (postoperative nausea and vomiting)     Past Surgical History:  Procedure Laterality Date  . JOINT REPLACEMENT    . SHOULDER ARTHROSCOPY W/ ROTATOR CUFF REPAIR Left 1992  . 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 Hyperlipidemia Mother   . Hypertension Mother   . Sudden death Father   . Diabetes Father   . Hyperlipidemia Father   . Hypertension Father     Social History   Socioeconomic History  . Marital status: Married    Spouse name: Not on file  . Number of children: Not on file  . Years of education: Not on file  . Highest education level: Not on file  Occupational History  . Not on file  Tobacco Use  . Smoking status: Never Smoker  . Smokeless tobacco: Never Used  Vaping Use  . Vaping Use: Never used  Substance and Sexual Activity  . Alcohol use: Yes    Comment:  09/12/2017 "couple drinks/year"  . Drug use: No  . Sexual activity: Yes  Other Topics Concern  . Not on file  Social History Narrative  . Not on file   Social Determinants of Health   Financial Resource Strain:   . Difficulty of Paying Living Expenses: Not on file  Food Insecurity:   . Worried About Charity fundraiser in the Last Year: Not on file  . Ran Out of Food in the Last Year: Not on file  Transportation Needs:   . Lack of Transportation (Medical): Not on file  . Lack of Transportation (Non-Medical): Not on file  Physical Activity:   . Days of Exercise per Week: Not on file  . Minutes of Exercise per Session: Not on file  Stress:   . Feeling of Stress : Not on file  Social Connections:   . Frequency of Communication with Friends and Family: Not on file  . Frequency of Social Gatherings with Friends and Family: Not on file  . Attends Religious Services: Not on file  . Active Member of Clubs or Organizations: Not on file  . Attends Archivist Meetings: Not on file  . Marital Status: Not on file  Intimate Partner Violence:   . Fear  of Current or Ex-Partner: Not on file  . Emotionally Abused: Not on file  . Physically Abused: Not on file  . Sexually Abused: Not on file     PHYSICAL EXAM:  VS: BP 119/80   Pulse 63   Ht 5\' 10"  (1.778 m)   Wt 200 lb (90.7 kg)   BMI 28.70 kg/m  Physical Exam Gen: NAD, alert, cooperative with exam, well-appearing MSK:  Near normal flexion. Near normal abduction. Normal strength resistance. Neurovascular intact     ASSESSMENT & PLAN:   Capsulitis of right shoulder Function and pain have been improving.  Still has some limitation with his range of motion. -Counseled on home exercise therapy and supportive care. -Referral to physical therapy. -Could consider glenohumeral injection or MRI if ongoing.

## 2020-07-11 NOTE — Assessment & Plan Note (Signed)
Function and pain have been improving.  Still has some limitation with his range of motion. -Counseled on home exercise therapy and supportive care. -Referral to physical therapy. -Could consider glenohumeral injection or MRI if ongoing.

## 2020-09-26 ENCOUNTER — Other Ambulatory Visit (HOSPITAL_BASED_OUTPATIENT_CLINIC_OR_DEPARTMENT_OTHER): Payer: Self-pay | Admitting: Internal Medicine

## 2020-09-26 ENCOUNTER — Ambulatory Visit: Payer: Self-pay | Attending: Internal Medicine

## 2020-09-26 DIAGNOSIS — Z23 Encounter for immunization: Secondary | ICD-10-CM

## 2020-09-26 NOTE — Progress Notes (Signed)
° °  Covid-19 Vaccination Clinic  Name:  Bruce Hart    MRN: [id number] DOB: 1966/10/03  09/26/2020  [name] was observed post Covid-19 immunization for 15 minutes without incident. He was provided with Vaccine Information Sheet and instruction to access the V-Safe system.   [name] was instructed to call 911 with any severe reactions post vaccine:  Difficulty breathing   Swelling of face and throat   A fast heartbeat   A bad rash all over body   Dizziness and weakness   Immunizations Administered    Name Date Dose VIS Date Route   Pfizer COVID-19 Vaccine 09/26/2020 11:17 AM 0.3 mL 07/30/2020 Intramuscular   Manufacturer: [place]   Lot: [id number]   [place]: [id number]

## 2020-09-29 MED FILL — PFIZER-BIONTECH COVID-19 VA: 30 | 1 days supply | Qty: 0 | Fill #0

## 2021-05-04 ENCOUNTER — Other Ambulatory Visit: Payer: Self-pay

## 2021-05-04 DIAGNOSIS — M1611 Unilateral primary osteoarthritis, right hip: Secondary | ICD-10-CM

## 2021-05-05 ENCOUNTER — Other Ambulatory Visit: Payer: Self-pay | Admitting: *Deleted

## 2021-05-05 NOTE — Patient Outreach (Signed)
[facility]) Care Management  05/05/2021  KWAMEL REGEN 12-03-1965 [id number]  Care Coordination-Successful-Case Closed  Received Referral 7/26 Initial Outreach 7/26  RN spoke with pt today and confirmed identifiers. Confirmed pt continues to take the prescribed antibiotics and hydrating as requested from the initial referral hotline. Pt states he fells better and is much improved. Pt states he has contacted the urgent care center where treated and follow up on his labs. Pt has been informed to follow up with his provider for ongoing interventions and possible follow up labs. Pt has indicated he would call and RN case manager has provider the contact number and reached out via chat and communicated the above to Dr. Harvie Heck requested to call pt for a follow up  appointment. Dr. Harvie Heck responded and will follow up accordingly.  RN inquired on any other issues as pt declined. RN also offered to follow up at a later date concerning this matter however pt declined any call backs.   No other issues to address at this time. Will closed with resolved issues and no additional  needs.  Raina Mina, RN Care Management Coordinator [place] Office [phone]

## 2021-05-06 ENCOUNTER — Ambulatory Visit: Payer: 59 | Admitting: Medical

## 2021-05-06 ENCOUNTER — Encounter: Payer: Self-pay | Admitting: Medical

## 2021-05-06 ENCOUNTER — Other Ambulatory Visit: Payer: Self-pay

## 2021-05-06 VITALS — BP 118/69 | HR 73 | Temp 98.7°F | Ht 70.0 in | Wt 201.6 lb

## 2021-05-06 DIAGNOSIS — Z1211 Encounter for screening for malignant neoplasm of colon: Secondary | ICD-10-CM

## 2021-05-06 DIAGNOSIS — Z0000 Encounter for general adult medical examination without abnormal findings: Secondary | ICD-10-CM

## 2021-05-06 DIAGNOSIS — Z0001 Encounter for general adult medical examination with abnormal findings: Secondary | ICD-10-CM

## 2021-05-06 DIAGNOSIS — R350 Frequency of micturition: Secondary | ICD-10-CM | POA: Diagnosis not present

## 2021-05-06 DIAGNOSIS — N390 Urinary tract infection, site not specified: Secondary | ICD-10-CM

## 2021-05-06 LAB — CBC WITH DIFFERENTIAL/PLATELET
Basophils Absolute: 0 10*3/uL (ref 0.0–0.1)
Basophils Relative: 1 % (ref 0.0–3.0)
Eosinophils Absolute: 0.2 10*3/uL (ref 0.0–0.7)
Eosinophils Relative: 5.9 % — ABNORMAL HIGH (ref 0.0–5.0)
HCT: 41.1 % (ref 39.0–52.0)
Hemoglobin: 13.7 g/dL (ref 13.0–17.0)
Lymphocytes Relative: 24 % (ref 12.0–46.0)
Lymphs Abs: 0.8 10*3/uL (ref 0.7–4.0)
MCHC: 33.3 g/dL (ref 30.0–36.0)
MCV: 88.9 fl (ref 78.0–100.0)
Monocytes Absolute: 0.5 10*3/uL (ref 0.1–1.0)
Monocytes Relative: 16.4 % — ABNORMAL HIGH (ref 3.0–12.0)
Neutro Abs: 1.6 10*3/uL (ref 1.4–7.7)
Neutrophils Relative %: 52.7 % (ref 43.0–77.0)
Platelets: 226 10*3/uL (ref 150.0–400.0)
RBC: 4.62 Mil/uL (ref 4.22–5.81)
RDW: 13.8 % (ref 11.5–15.5)
WBC: 3.1 10*3/uL — ABNORMAL LOW (ref 4.0–10.5)

## 2021-05-06 LAB — POC URINALSYSI DIPSTICK (AUTOMATED)
Bilirubin, UA: NEGATIVE
Blood, UA: NEGATIVE
Glucose, UA: NEGATIVE
Ketones, UA: NEGATIVE
Nitrite, UA: NEGATIVE
Protein, UA: POSITIVE — AB
Spec Grav, UA: 1.02 (ref 1.010–1.025)
Urobilinogen, UA: 0.2 E.U./dL
pH, UA: 6 (ref 5.0–8.0)

## 2021-05-06 LAB — LIPID PANEL
Cholesterol: 219 mg/dL — ABNORMAL HIGH (ref 0–200)
HDL: 43.4 mg/dL (ref >39.00)
LDL Cholesterol: 152 mg/dL — ABNORMAL HIGH (ref 0–99)
NonHDL: 175.87
Total CHOL/HDL Ratio: 5
Triglycerides: 119 mg/dL (ref 0.0–149.0)
VLDL: 23.8 mg/dL (ref 0.0–40.0)

## 2021-05-06 LAB — COMPREHENSIVE METABOLIC PANEL
ALT: 39 U/L (ref 0–53)
AST: 35 U/L (ref 0–37)
Albumin: 4 g/dL (ref 3.5–5.2)
Alkaline Phosphatase: 88 U/L (ref 39–117)
BUN: 19 mg/dL (ref 6–23)
CO2: 30 mEq/L (ref 19–32)
Calcium: 9.7 mg/dL (ref 8.4–10.5)
Chloride: 101 mEq/L (ref 96–112)
Creatinine, Ser: 1.31 mg/dL (ref 0.40–1.50)
GFR: 60.95 mL/min (ref >60.00)
Glucose, Bld: 106 mg/dL — ABNORMAL HIGH (ref 70–99)
Potassium: 4.5 mEq/L (ref 3.5–5.1)
Sodium: 138 mEq/L (ref 135–145)
Total Bilirubin: 1 mg/dL (ref 0.2–1.2)
Total Protein: 6.8 g/dL (ref 6.0–8.3)

## 2021-05-06 LAB — PSA: PSA: 26.93 ng/mL — ABNORMAL HIGH (ref 0.10–4.00)

## 2021-05-06 NOTE — Progress Notes (Signed)
Subjective:    Patient ID: Bruce Hart, male    DOB: 02-07-1966, 56 y.o.   MRN: [id number]  HPI  Pt in for evaluation for folluw up urgent care. After discussion decided to go ahead and do wellness exam since I have only seen once and want to get understanding of overall health.   No hx of colonoscopy. Will refer.  Not up to date on tdap. Has had 3 covid vaccines.  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 Moderate healthy diet. Minimal fried foods. Non smoker. Rare social alcohol use. 1 cup of coffee a day. Occasional soda.     Hpi from urgent care.   "Bruce Hart is a 55 y.o. male Presents to UC with complaint of urinary frequency, urgency, fever, epigastric pain. Patient states fever started yesterday. Reports temperature up to 100.8. He has noticed last night that he started using bathroom frequently. States he urinated about 10 times overnight. He states he goes a little at a time. He denies any back pain or lower abdominal pain. No hematuria. No dysuria. He does have history of kidney stones but this does not feel the same. He also reports epigastric discomfort which he describes as "like I have not eaten in several days." He states although it is mildly painful he explains it is more of a discomfort. He does report nausea, no vomiting. He has not had a bowel movement since yesterday. No prior abdominal surgeries. He has tried taking TheraFlu when he started feeling feverish yesterday and has tried Tylenol. He did not take any medications this morning. He denies sick contacts. He had COVID-19 infection a month ago."  Pt given cipro antibiotic. Culture result seen 50,000-100,000. E coli on test. Cipro adequate for the bacteria.  Pt symptoms much improved. Now close to normal frequency of 3 times a day.   No fever, no chills or sweats.       Review of Systems  Constitutional:  Negative for chills, fatigue and fever.  HENT:  Negative for congestion, ear pain and facial  swelling.   Respiratory:  Negative for cough, choking and wheezing.   Cardiovascular:  Negative for chest pain and palpitations.  Gastrointestinal:  Negative for abdominal distention, anal bleeding, blood in stool, constipation and nausea.  Genitourinary:  Negative for dysuria, frequency, hematuria, testicular pain and urgency.       See hpi. Close to baseline now.  Musculoskeletal:  Negative for back pain, myalgias and neck pain.  Skin:  Negative for rash.   Past Medical History:  Diagnosis Date   Anemia    as teenager   Arthritis    "all over" (09/12/2017)   Arthritis, multiple joint involvement    Hepatitis 1969   Hep B?    History of kidney stones    PONV (postoperative nausea and vomiting)      Social History   Socioeconomic History   Marital status: Married    Spouse name: Not on file   Number of children: Not on file   Years of education: Not on file   Highest education level: Not on file  Occupational History   Not on file  Tobacco Use   Smoking status: Never   Smokeless tobacco: Never  Vaping Use   Vaping Use: Never used  Substance and Sexual Activity   Alcohol use: Yes    Comment: 09/12/2017 "couple drinks/year"   Drug use: No   Sexual activity: Yes  Other Topics Concern   Not  on file  Social History Narrative   Not on file   Social Determinants of Health   Financial Resource Strain: Not on file  Food Insecurity: Not on file  Transportation Needs: Not on file  Physical Activity: Not on file  Stress: Not on file  Social Connections: Not on file  Intimate Partner Violence: Not on file    Past Surgical History:  Procedure Laterality Date   JOINT REPLACEMENT     SHOULDER ARTHROSCOPY W/ ROTATOR CUFF REPAIR Left [place] 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   Procedure: RIGHT TOTAL HIP ARTHROPLASTY ANTERIOR APPROACH;  Surgeon: Rod Can, MD;  Location: [place];  Service: Orthopedics;  Laterality: Right;   Needs RNFA    Family History  Problem Relation Age of Onset   Hyperlipidemia Mother    Hypertension Mother    Sudden death Father    Diabetes Father    Hyperlipidemia Father    Hypertension Father     Allergies  Allergen Reactions   Tramadol Other (See Comments)    SWEATING DISORIENTATION DELUSIONS    Current Outpatient Medications on File Prior to Visit  Medication Sig Dispense Refill   aspirin 81 MG chewable tablet Chew 1 tablet (81 mg total) by mouth 2 (two) times daily. 60 tablet 1   azithromycin (ZITHROMAX) 250 MG tablet Take 2 tablets by mouth on day 1, followed by 1 tablet by mouth daily for 4 days. 6 tablet 0   benzonatate (TESSALON) 100 MG capsule Take 1 capsule (100 mg total) by mouth 3 (three) times daily as needed for cough. 30 capsule 0   COVID-19 mRNA vaccine, Pfizer, 30 MCG/0.3ML injection INJECT AS DIRECTED .3 mL 0   diclofenac (VOLTAREN) 75 MG EC tablet Take 1 tablet (75 mg total) by mouth 2 (two) times daily as needed. 60 tablet 1   Multiple Vitamin (MULTIVITAMIN WITH MINERALS) TABS tablet Take 1 tablet by mouth 4 (four) times a week.     sildenafil (REVATIO) 20 MG tablet TAKE 2 TO 5 TABLETS BY MOUTH AS NEEDED 30 MINUTES PRIOR TO SEXUAL INTERCOURSE FOR 30 DAYS  0   No current facility-administered medications on file prior to visit.    BP 118/69   Pulse 73   Temp 98.7 F (37.1 C)   Ht '5\' 10"'$  (1.778 m)   Wt 201 lb 9.6 oz (91.4 kg)   SpO2 99%   BMI 28.93 kg/m        Objective:   Physical Exam  General Mental Status- Alert. General Appearance- Not in acute distress.   Skin General: Color- Normal Color. Moisture- Normal Moisture.  Neck Carotid Arteries- Normal color. Moisture- Normal Moisture. No carotid bruits. No JVD.  Chest and Lung Exam Auscultation: Breath Sounds:-Normal.  Cardiovascular Auscultation:Rythm- Regular. Murmurs & Other Heart Sounds:Auscultation of the heart reveals- No Murmurs.  Abdomen Inspection:-Inspeection  Normal. Palpation/Percussion:Note:No mass. Palpation and Percussion of the abdomen reveal- Non Tender, Non Distended + BS, no rebound or guarding.   Neurologic Cranial Nerve exam:- CN III-XII intact(No nystagmus), symmetric smile. Strength:- 5/5 equal and symmetric strength both upper and lower extremities.   Back- no cva tenderness.    Assessment & Plan:   For you wellness exam today I have ordered cbc, cmp and  lipid panel.  Vaccine deferred today. Discussed tdap. Can get on later date as nurse visit.   Recommend exercise and healthy diet.  We will let you know lab results as they come in.  Follow up date appointment will be determined after lab review.    Recent uti. Symptoms resolved/much improved with cipro. Urine still showing infection fighting cells present. Will add psa to labs as uti can be associated with prostatitis.   Mackie Pai, [place]   [id number] today as well. In addition to wellness addressed/reviewed recent uti/follow up from UC.

## 2021-05-06 NOTE — Patient Instructions (Addendum)
For you wellness exam today I have ordered cbc, cmp and  lipid panel.  Vaccine deferred today. Discussed tdap. Can get on later date as nurse visit.   Recommend exercise and healthy diet.  We will let you know lab results as they come in.  Follow up date appointment will be determined after lab review.    Recent uti. Symptoms resolved/much improved with cipro. Urine still showing infection fighting cells present. Will add psa to labs as uti can be associated with prostatitis. Will follow psa and may give more days of cipro. If symptoms recurrent give more days as well.  Preventive Care 61-28 Years Old, Male Preventive care refers to lifestyle choices and visits with your health care provider that can promote health and wellness. This includes: A yearly physical exam. This is also called an annual wellness visit. Regular dental and eye exams. Immunizations. Screening for certain conditions. Healthy lifestyle choices, such as: Eating a healthy diet. Getting regular exercise. Not using drugs or products that contain nicotine and tobacco. Limiting alcohol use. What can I expect for my preventive care visit? Physical exam Your health care provider will check your: Height and weight. These may be used to calculate your BMI (body mass index). BMI is a measurement that tells if you are at a healthy weight. Heart rate and blood pressure. Body temperature. Skin for abnormal spots. Counseling Your health care provider may ask you questions about your: Past medical problems. Family's medical history. Alcohol, tobacco, and drug use. Emotional well-being. Home life and relationship well-being. Sexual activity. Diet, exercise, and sleep habits. Work and work Statistician. Access to firearms. What immunizations do I need?  Vaccines are usually given at various ages, according to a schedule. Your health care provider will recommend vaccines for you based on your age, medicalhistory, and  lifestyle or other factors, such as travel or where you work. What tests do I need? Blood tests Lipid and cholesterol levels. These may be checked every 5 years, or more often if you are over 65 years old. Hepatitis C test. Hepatitis B test. Screening Lung cancer screening. You may have this screening every year starting at age 21 if you have a 30-pack-year history of smoking and currently smoke or have quit within the past 15 years. Prostate cancer screening. Recommendations will vary depending on your family history and other risks. Genital exam to check for testicular cancer or hernias. Colorectal cancer screening. All adults should have this screening starting at age 21 and continuing until age 33. Your health care provider may recommend screening at age 32 if you are at increased risk. You will have tests every 1-10 years, depending on your results and the type of screening test. Diabetes screening. This is done by checking your blood sugar (glucose) after you have not eaten for a while (fasting). You may have this done every 1-3 years. STD (sexually transmitted disease) testing, if you are at risk. Follow these instructions at home: Eating and drinking  Eat a diet that includes fresh fruits and vegetables, whole grains, lean protein, and low-fat dairy products. Take vitamin and mineral supplements as recommended by your health care provider. Do not drink alcohol if your health care provider tells you not to drink. If you drink alcohol: Limit how much you have to 0-2 drinks a day. Be aware of how much alcohol is in your drink. In the [place] one drink equals one 12 oz bottle of beer (355 mL), one 5 oz glass of wine (148  mL), or one 1 oz glass of hard liquor (44 mL).  Lifestyle Take daily care of your teeth and gums. Brush your teeth every morning and night with fluoride toothpaste. Floss one time each day. Stay active. Exercise for at least 30 minutes 5 or more days each week. Do  not use any products that contain nicotine or tobacco, such as cigarettes, e-cigarettes, and chewing tobacco. If you need help quitting, ask your health care provider. Do not use drugs. If you are sexually active, practice safe sex. Use a condom or other form of protection to prevent STIs (sexually transmitted infections). If told by your health care provider, take low-dose aspirin daily starting at age 21. Find healthy ways to cope with stress, such as: Meditation, yoga, or listening to music. Journaling. Talking to a trusted person. Spending time with friends and family. Safety Always wear your seat belt while driving or riding in a vehicle. Do not drive: If you have been drinking alcohol. Do not ride with someone who has been drinking. When you are tired or distracted. While texting. Wear a helmet and other protective equipment during sports activities. If you have firearms in your house, make sure you follow all gun safety procedures. What's next? Go to your health care provider once a year for an annual wellness visit. Ask your health care provider how often you should have your eyes and teeth checked. Stay up to date on all vaccines. This information is not intended to replace advice given to you by your health care provider. Make sure you discuss any questions you have with your healthcare provider. Document Revised: 06/26/2019 Document Reviewed: 09/21/2018 Elsevier Patient Education  2022 [employer].

## 2021-05-07 ENCOUNTER — Other Ambulatory Visit (HOSPITAL_BASED_OUTPATIENT_CLINIC_OR_DEPARTMENT_OTHER): Payer: Self-pay

## 2021-05-07 MED ORDER — CIPROFLOXACIN HCL 500 MG PO TABS
500.0000 mg | ORAL_TABLET | Freq: Two times a day (BID) | ORAL | 0 refills | Status: DC
  Filled 2021-05-07 – 2021-05-08 (×2): qty 28, 14d supply, fill #0

## 2021-05-07 NOTE — Addendum Note (Signed)
Addended by: Anabel Halon on: 05/07/2021 06:50 AM   Modules accepted: Orders

## 2021-05-08 ENCOUNTER — Other Ambulatory Visit (HOSPITAL_BASED_OUTPATIENT_CLINIC_OR_DEPARTMENT_OTHER): Payer: Self-pay

## 2021-05-11 ENCOUNTER — Other Ambulatory Visit (HOSPITAL_BASED_OUTPATIENT_CLINIC_OR_DEPARTMENT_OTHER): Payer: Self-pay

## 2021-05-20 ENCOUNTER — Encounter: Payer: Self-pay | Admitting: Medical

## 2021-05-20 ENCOUNTER — Other Ambulatory Visit: Payer: Self-pay

## 2021-05-20 ENCOUNTER — Ambulatory Visit: Payer: 59 | Admitting: Medical

## 2021-05-20 VITALS — BP 106/64 | HR 92 | Temp 98.3°F | Resp 20 | Ht 70.0 in | Wt 202.0 lb

## 2021-05-20 DIAGNOSIS — R739 Hyperglycemia, unspecified: Secondary | ICD-10-CM | POA: Diagnosis not present

## 2021-05-20 DIAGNOSIS — E785 Hyperlipidemia, unspecified: Secondary | ICD-10-CM

## 2021-05-20 DIAGNOSIS — R972 Elevated prostate specific antigen [PSA]: Secondary | ICD-10-CM

## 2021-05-20 DIAGNOSIS — N419 Inflammatory disease of prostate, unspecified: Secondary | ICD-10-CM

## 2021-05-20 DIAGNOSIS — N390 Urinary tract infection, site not specified: Secondary | ICD-10-CM

## 2021-05-20 LAB — PSA: PSA: 5.24 ng/mL — ABNORMAL HIGH (ref 0.10–4.00)

## 2021-05-20 LAB — HEMOGLOBIN A1C: Hgb A1c MFr Bld: 6.1 % (ref 4.6–6.5)

## 2021-05-20 NOTE — Progress Notes (Signed)
Subjective:    Patient ID: Bruce Hart, male    DOB: 06-25-1966, 55 y.o.   MRN: [id number]  HPI  Pt in for follow up.   Pt states he feel like urinary symptoms have resolved with use of cipro. He states symptoms started to resolve quickly. Now urinating normal flow and no pain.  In urgent care before he saw me in" below.  "Bruce Hart is a 55 y.o. male Presents to UC with complaint of urinary frequency, urgency, fever, epigastric pain. Patient states fever started yesterday. Reports temperature up to 100.8. He has noticed last night that he started using bathroom frequently. States he urinated about 10 times overnight. He states he goes a little at a time. He denies any back pain or lower abdominal pain. No hematuria. No dysuria. He does have history of kidney stones but this does not feel the same. He also reports epigastric discomfort which he describes as "like I have not eaten in several days." He states although it is mildly painful he explains it is more of a discomfort. He does report nausea, no vomiting. He has not had a bowel movement since yesterday. No prior abdominal surgeries. He has tried taking TheraFlu when he started feeling feverish yesterday and has tried Tylenol. He did not take any medications this morning. He denies sick contacts. He had COVID-19 infection a month ago."   Pt given cipro antibiotic. Culture result seen 50,000-100,000. E coli on test. Cipro adequate for the bacteria."  Psa done on day of visit with showed 26.93.  I had written 2 weeks of cipro.   Pt had elevated cholesterol on labs. Advise conservative measures/diet and fish oil. Dad had mi in 4's. Also brother had quadruple bypass. Neither dad or brother smokers.    Pt had mild high sugar on last labs. Advise low sugar diet and exercise. Decided to get a1c today.    Review of Systems  Constitutional:  Negative for chills, fatigue and fever.  HENT:  Negative for congestion and drooling.    Respiratory:  Negative for cough, chest tightness, shortness of breath and wheezing.   Cardiovascular:  Negative for chest pain and palpitations.  Gastrointestinal:  Negative for abdominal pain.  Genitourinary:  Negative for difficulty urinating, dysuria, frequency and hematuria.  Musculoskeletal:  Negative for back pain.  Neurological:  Negative for dizziness, speech difficulty, light-headedness and headaches.  Hematological:  Negative for adenopathy. Does not bruise/bleed easily.  Psychiatric/Behavioral:  Negative for behavioral problems, decreased concentration and dysphoric mood.     Past Medical History:  Diagnosis Date   Anemia    as teenager   Arthritis    "all over" (09/12/2017)   Arthritis, multiple joint involvement    Hepatitis 1969   Hep B?    History of kidney stones    PONV (postoperative nausea and vomiting)      Social History   Socioeconomic History   Marital status: Married    Spouse name: Not on file   Number of children: Not on file   Years of education: Not on file   Highest education level: Not on file  Occupational History   Not on file  Tobacco Use   Smoking status: Never   Smokeless tobacco: Never  Vaping Use   Vaping Use: Never used  Substance and Sexual Activity   Alcohol use: Yes    Comment: 09/12/2017 "couple drinks/year"   Drug use: No   Sexual activity: Yes  Other Topics Concern  Not on file  Social History Narrative   Not on file   Social Determinants of Health   Financial Resource Strain: Not on file  Food Insecurity: Not on file  Transportation Needs: Not on file  Physical Activity: Not on file  Stress: Not on file  Social Connections: Not on file  Intimate Partner Violence: Not on file    Past Surgical History:  Procedure Laterality Date   JOINT REPLACEMENT     SHOULDER ARTHROSCOPY W/ ROTATOR CUFF REPAIR Left [place] 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   Procedure: RIGHT  TOTAL HIP ARTHROPLASTY ANTERIOR APPROACH;  Surgeon: Rod Can, MD;  Location: [facility];  Service: Orthopedics;  Laterality: Right;  Needs RNFA    Family History  Problem Relation Age of Onset   Hyperlipidemia Mother    Hypertension Mother    Sudden death Father    Diabetes Father    Hyperlipidemia Father    Hypertension Father     Allergies  Allergen Reactions   Tramadol Other (See Comments)    SWEATING DISORIENTATION DELUSIONS    Current Outpatient Medications on File Prior to Visit  Medication Sig Dispense Refill   ciprofloxacin (CIPRO) 500 MG tablet Take 1 tablet (500 mg total) by mouth 2 (two) times daily. 28 tablet 0   COVID-19 mRNA vaccine, Pfizer, 30 MCG/0.3ML injection INJECT AS DIRECTED .3 mL 0   Multiple Vitamin (MULTIVITAMIN WITH MINERALS) TABS tablet Take 1 tablet by mouth 4 (four) times a week.     sildenafil (REVATIO) 20 MG tablet TAKE 2 TO 5 TABLETS BY MOUTH AS NEEDED 30 MINUTES PRIOR TO SEXUAL INTERCOURSE FOR 30 DAYS  0   aspirin 81 MG chewable tablet Chew 1 tablet (81 mg total) by mouth 2 (two) times daily. 60 tablet 1   azithromycin (ZITHROMAX) 250 MG tablet Take 2 tablets by mouth on day 1, followed by 1 tablet by mouth daily for 4 days. 6 tablet 0   benzonatate (TESSALON) 100 MG capsule Take 1 capsule (100 mg total) by mouth 3 (three) times daily as needed for cough. 30 capsule 0   diclofenac (VOLTAREN) 75 MG EC tablet Take 1 tablet (75 mg total) by mouth 2 (two) times daily as needed. 60 tablet 1   No current facility-administered medications on file prior to visit.    BP 106/64   Pulse 92   Temp 98.3 F (36.8 C)   Resp 20   Ht '5\' 10"'$  (1.778 m)   Wt 202 lb (91.6 kg)   SpO2 98%   BMI 28.98 kg/m       Objective:   Physical Exam  General- No acute distress. Pleasant patient. Neck- Full range of motion, no jvd Lungs- Clear, even and unlabored. Heart- regular rate and rhythm. Neurologic- CNII- XII grossly intact.       Assessment & Plan:    History of elevated psa/prostatitis with UTI. Clinically better on cipro. Will get psa today then decide on possible referral to urologist.  For elevated cholesterol advise low cholesterol diet, fish oil over the counter and exercise. With risk score and family history would consider lower threshold to start prescription medication.  For elevated sugar advise low sugar diet and get a1c today.  Follow up 3 months repeat lipid panel or sooner if needed.  Mackie Pai, PA-C

## 2021-05-20 NOTE — Patient Instructions (Signed)
History of elevated psa/prostatitis with UTI. Clinically better on cipro. Will get psa today then decide on possible referral to urologist.  For elevated cholesterol advise low cholesterol diet, fish oil over the counter and exercise. With risk score and family history would consider lower threshold to start prescription medication.  For elevated sugar advise low sugar diet and get a1c today.  Follow up 3 months repeat lipid panel or sooner if needed.

## 2021-05-20 NOTE — Addendum Note (Signed)
Addended by: Anabel Halon on: 05/20/2021 07:13 PM   Modules accepted: Orders

## 2021-06-11 ENCOUNTER — Other Ambulatory Visit (HOSPITAL_COMMUNITY): Payer: Self-pay

## 2021-06-11 MED ORDER — TAMSULOSIN HCL 0.4 MG PO CAPS
ORAL_CAPSULE | ORAL | 3 refills | Status: DC
  Filled 2021-06-11: qty 30, 30d supply, fill #0
  Filled 2021-07-08: qty 30, 30d supply, fill #1
  Filled 2021-08-13: qty 30, 30d supply, fill #2

## 2021-07-09 ENCOUNTER — Other Ambulatory Visit (HOSPITAL_COMMUNITY): Payer: Self-pay

## 2021-08-14 ENCOUNTER — Other Ambulatory Visit (HOSPITAL_COMMUNITY): Payer: Self-pay

## 2021-08-19 ENCOUNTER — Other Ambulatory Visit: Payer: Self-pay

## 2021-08-20 ENCOUNTER — Ambulatory Visit: Payer: 59 | Admitting: Medical

## 2021-08-20 VITALS — BP 123/67 | HR 66 | Temp 98.1°F | Resp 18 | Ht 70.0 in | Wt 192.0 lb

## 2021-08-20 DIAGNOSIS — R739 Hyperglycemia, unspecified: Secondary | ICD-10-CM | POA: Diagnosis not present

## 2021-08-20 DIAGNOSIS — E785 Hyperlipidemia, unspecified: Secondary | ICD-10-CM | POA: Diagnosis not present

## 2021-08-20 DIAGNOSIS — N400 Enlarged prostate without lower urinary tract symptoms: Secondary | ICD-10-CM

## 2021-08-20 DIAGNOSIS — R972 Elevated prostate specific antigen [PSA]: Secondary | ICD-10-CM | POA: Diagnosis not present

## 2021-08-20 LAB — COMPREHENSIVE METABOLIC PANEL
ALT: 29 U/L (ref 0–53)
AST: 32 U/L (ref 0–37)
Albumin: 4.2 g/dL (ref 3.5–5.2)
Alkaline Phosphatase: 75 U/L (ref 39–117)
BUN: 20 mg/dL (ref 6–23)
CO2: 29 mEq/L (ref 19–32)
Calcium: 9.1 mg/dL (ref 8.4–10.5)
Chloride: 105 mEq/L (ref 96–112)
Creatinine, Ser: 1.17 mg/dL (ref 0.40–1.50)
GFR: 69.66 mL/min (ref >60.00)
Glucose, Bld: 101 mg/dL — ABNORMAL HIGH (ref 70–99)
Potassium: 4.2 mEq/L (ref 3.5–5.1)
Sodium: 140 mEq/L (ref 135–145)
Total Bilirubin: 1.5 mg/dL — ABNORMAL HIGH (ref 0.2–1.2)
Total Protein: 6.5 g/dL (ref 6.0–8.3)

## 2021-08-20 LAB — LIPID PANEL
Cholesterol: 216 mg/dL — ABNORMAL HIGH (ref 0–200)
HDL: 66.2 mg/dL (ref >39.00)
LDL Cholesterol: 137 mg/dL — ABNORMAL HIGH (ref 0–99)
NonHDL: 149.41
Total CHOL/HDL Ratio: 3
Triglycerides: 63 mg/dL (ref 0.0–149.0)
VLDL: 12.6 mg/dL (ref 0.0–40.0)

## 2021-08-20 LAB — HEMOGLOBIN A1C: Hgb A1c MFr Bld: 6 % (ref 4.6–6.5)

## 2021-08-20 NOTE — Patient Instructions (Addendum)
Hyperlipidemia. Will check cmp and lipid panel today. Will see how numbers look after improved diet and exercise. Consider statin if numbers persistently elevated or worsen.  For mild elevated sugar check a1c. Continue low sugar diet.  For bph and elevated psa continue flomax. Follow up with urologist in one month.  Referal to gi placed for colonoscopy. You can go up stairs and get scheduled.   Tdap declined.  Follow up date to be determined after lab review.

## 2021-08-20 NOTE — Progress Notes (Signed)
Subjective:    Patient ID: Bruce Hart, male    DOB: [birth year], 55 y.o.   MRN: [id number]  HPI  Pt in for follow up.  Pt has been eating better and exercising almost evey day. 20 pounds of purposeful wt loss over past 6 months.   Elevated cholesterol. The 10-year ASCVD risk score (Arnett DK, et al., 2019) is: 7.1%   Values used to calculate the score:     Age: 2 years     Sex: Male     Is Non-Hispanic African American: No     Diabetic: No     Tobacco smoker: No     Systolic Blood Pressure: 470 mmHg     Is BP treated: No     HDL Cholesterol: 43.4 mg/dL     Total Cholesterol: 219 mg/dL    Fh positiive for tia and heart disease.  Psa elevated in summer. Treated with cipro and advised to follow up in 2 weeks. Pt repeat psa recently was high but in 5 range. Urologist prescribed flomax. Pt will followup with urologist next month.   Referral GI placed for colonoscopy.   Review of Systems  Constitutional:  Negative for chills, fatigue and fever.  Respiratory:  Negative for cough, chest tightness, shortness of breath and wheezing.   Cardiovascular:  Negative for chest pain and palpitations.  Gastrointestinal:  Negative for abdominal distention and abdominal pain.  Musculoskeletal:  Negative for back pain.  Skin:  Negative for rash.  Neurological:  Negative for dizziness, light-headedness and headaches.  Hematological:  Negative for adenopathy. Does not bruise/bleed easily.  Psychiatric/Behavioral:  Negative for behavioral problems and decreased concentration. The patient is not nervous/anxious and is not hyperactive.     Past Medical History:  Diagnosis Date   Anemia    as teenager   Arthritis    "all over" (09/12/2017)   Arthritis, multiple joint involvement    Hepatitis 1969   Hep B?    History of kidney stones    PONV (postoperative nausea and vomiting)      Social History   Socioeconomic History   Marital status: Married    Spouse name: Not on file    Number of children: Not on file   Years of education: Not on file   Highest education level: Not on file  Occupational History   Not on file  Tobacco Use   Smoking status: Never   Smokeless tobacco: Never  Vaping Use   Vaping Use: Never used  Substance and Sexual Activity   Alcohol use: Yes    Comment: 09/12/2017 "couple drinks/year"   Drug use: No   Sexual activity: Yes  Other Topics Concern   Not on file  Social History Narrative   Not on file   Social Determinants of Health   Financial Resource Strain: Not on file  Food Insecurity: Not on file  Transportation Needs: Not on file  Physical Activity: Not on file  Stress: Not on file  Social Connections: Not on file  Intimate Partner Violence: Not on file    Past Surgical History:  Procedure Laterality Date   JOINT REPLACEMENT     SHOULDER ARTHROSCOPY [place] 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   Procedure: RIGHT TOTAL HIP ARTHROPLASTY ANTERIOR APPROACH;  Surgeon: Rod Can, MD;  Location: [facility];  Service: Orthopedics;  Laterality: Right;  Needs RNFA    Family History  Problem Relation  Age of Onset   Hyperlipidemia Mother    Hypertension Mother    Sudden death Father    Diabetes Father    Hyperlipidemia Father    Hypertension Father     Allergies  Allergen Reactions   Tramadol Other (See Comments)    SWEATING DISORIENTATION DELUSIONS    Current Outpatient Medications on File Prior to Visit  Medication Sig Dispense Refill   aspirin 81 MG chewable tablet Chew 1 tablet (81 mg total) by mouth 2 (two) times daily. 60 tablet 1   diclofenac (VOLTAREN) 75 MG EC tablet Take 1 tablet (75 mg total) by mouth 2 (two) times daily as needed. 60 tablet 1   Multiple Vitamin (MULTIVITAMIN WITH MINERALS) TABS tablet Take 1 tablet by mouth 4 (four) times a week.     sildenafil (REVATIO) 20 MG tablet TAKE 2 TO 5 TABLETS BY MOUTH AS NEEDED 30 MINUTES  PRIOR TO SEXUAL INTERCOURSE FOR 30 DAYS  0   tamsulosin (FLOMAX) 0.4 MG CAPS capsule Take 1 capsule by mouth daily 90 capsule 3   azithromycin (ZITHROMAX) 250 MG tablet Take 2 tablets by mouth on day 1, followed by 1 tablet by mouth daily for 4 days. 6 tablet 0   benzonatate (TESSALON) 100 MG capsule Take 1 capsule (100 mg total) by mouth 3 (three) times daily as needed for cough. 30 capsule 0   ciprofloxacin (CIPRO) 500 MG tablet Take 1 tablet (500 mg total) by mouth 2 (two) times daily. 28 tablet 0   COVID-19 mRNA vaccine, Pfizer, 30 MCG/0.3ML injection INJECT AS DIRECTED .3 mL 0   No current facility-administered medications on file prior to visit.    BP 123/67   Pulse 66   Temp 98.1 F (36.7 C)   Resp 18   Ht 5\' 10"  (1.778 m)   Wt 192 lb (87.1 kg)   SpO2 100%   BMI 27.55 kg/m       Objective:   Physical Exam  General- No acute distress. Pleasant patient. Neck- Full range of motion, no jvd Lungs- Clear, even and unlabored. Heart- regular rate and rhythm. Neurologic- CNII- XII grossly intact.       Assessment & Plan:   Patient Instructions  Hyperlipidemia. Will check cmp and lipid panel today. Will see how numbers look after improved diet and exercise. Consider statin if numbers persistently elevated or worsen.  For mild elevated sugar check a1c. Continue low sugar diet.  For bph and elevated psa continue flomax. Follow up with urologist in one month.  Referal to gi placed for colonoscopy. You can go up stairs and get scheduled.   Tdap declined.  Follow up date to be determined after lab review.    Mackie Pai, PA-C

## 2021-09-10 ENCOUNTER — Other Ambulatory Visit (HOSPITAL_COMMUNITY): Payer: Self-pay

## 2021-09-10 MED ORDER — TAMSULOSIN HCL 0.4 MG PO CAPS
ORAL_CAPSULE | ORAL | 3 refills | Status: DC
  Filled 2021-09-10: qty 30, 30d supply, fill #0
  Filled 2021-11-24: qty 30, 30d supply, fill #1
  Filled 2022-01-20: qty 30, 30d supply, fill #2
  Filled 2022-03-01: qty 30, 30d supply, fill #3
  Filled 2022-04-06: qty 30, 30d supply, fill #4
  Filled 2022-04-07: qty 30, 30d supply, fill #0
  Filled 2022-05-23 – 2022-05-24 (×2): qty 30, 30d supply, fill #1
  Filled 2022-07-07: qty 30, 30d supply, fill #2
  Filled 2022-07-09: qty 30, 30d supply, fill #0
  Filled 2022-08-08: qty 30, 30d supply, fill #1
  Filled 2022-09-03: qty 30, 30d supply, fill #2

## 2021-10-19 ENCOUNTER — Ambulatory Visit: Payer: 59 | Admitting: Family Medicine

## 2021-10-19 ENCOUNTER — Ambulatory Visit: Payer: Self-pay

## 2021-10-19 ENCOUNTER — Other Ambulatory Visit: Payer: Self-pay

## 2021-10-19 ENCOUNTER — Ambulatory Visit (HOSPITAL_BASED_OUTPATIENT_CLINIC_OR_DEPARTMENT_OTHER)
Admission: RE | Admit: 2021-10-19 | Discharge: 2021-10-19 | Disposition: A | Discharge status: Home / Self Care | Payer: 59 | Source: Ambulatory Visit | Attending: Family Medicine | Admitting: Family Medicine

## 2021-10-19 ENCOUNTER — Encounter: Payer: Self-pay | Admitting: Family Medicine

## 2021-10-19 VITALS — BP 114/74 | Ht 70.0 in | Wt 178.0 lb

## 2021-10-19 DIAGNOSIS — M19011 Primary osteoarthritis, right shoulder: Secondary | ICD-10-CM | POA: Diagnosis not present

## 2021-10-19 DIAGNOSIS — M6520 Calcific tendinitis, unspecified site: Secondary | ICD-10-CM | POA: Diagnosis not present

## 2021-10-19 DIAGNOSIS — M1712 Unilateral primary osteoarthritis, left knee: Secondary | ICD-10-CM | POA: Insufficient documentation

## 2021-10-19 DIAGNOSIS — M778 Other enthesopathies, not elsewhere classified: Secondary | ICD-10-CM | POA: Diagnosis not present

## 2021-10-19 MED ORDER — TRIAMCINOLONE ACETONIDE 40 MG/ML IJ SUSP
40.0000 mg | Freq: Once | INTRAMUSCULAR | Status: AC
  Administered 2021-10-19: 40 mg via INTRA_ARTICULAR

## 2021-10-19 NOTE — Assessment & Plan Note (Signed)
Having calcific changes as well as impingement appreciated on exam. -Counseled on home exercise therapy and supportive care. -Could consider injection or further imaging. -Pursue shockwave therapy.

## 2021-10-19 NOTE — Assessment & Plan Note (Signed)
Acutely occurring.  Degenerative changes appreciated. -Counseled on home exercise therapy and supportive care. -Aspiration injection. -X-ray. -Could consider gel injection.

## 2021-10-19 NOTE — Progress Notes (Signed)
°  Bruce Hart - 56 y.o. male MRN [id number]  Date of birth: December 09, 1965  SUBJECTIVE:  Including CC & ROS.  No chief complaint on file.   Bruce Hart is a 56 y.o. male that is presenting with acute on chronic right shoulder pain and acute left knee pain.  No previous problems with the left knee.    Review of Systems See HPI   HISTORY: Past Medical, Surgical, Social, and Family History Reviewed & Updated per EMR.   Pertinent Historical Findings include:  Past Medical History:  Diagnosis Date   Anemia    as teenager   Arthritis    "all over" (09/12/2017)   Arthritis, multiple joint involvement    Hepatitis 1969   Hep B?    History of kidney stones    PONV (postoperative nausea and vomiting)     Past Surgical History:  Procedure Laterality Date   JOINT REPLACEMENT     SHOULDER ARTHROSCOPY W/ ROTATOR CUFF REPAIR Left 1992   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   Procedure: RIGHT TOTAL HIP ARTHROPLASTY ANTERIOR APPROACH;  Surgeon: Rod Can, MD;  Location: [facility];  Service: Orthopedics;  Laterality: Right;  Needs RNFA     PHYSICAL EXAM:  VS: BP 114/74 (BP Location: Left Arm, Patient Position: Sitting)    Ht 5\' 10"  (1.778 m)    Wt 178 lb (80.7 kg)    BMI 25.54 kg/m  Physical Exam Gen: NAD, alert, cooperative with exam, well-appearing MSK:  Neurovascularly intact    Limited ultrasound: Right shoulder, left knee:  Right shoulder:  Mild effusion surrounding the biceps tendon. Normal-appearing subscapularis. Effusion noted at the origin of the biceps tendon proximally. Calcific changes at the supraspinatus insertion. Impingement appreciated with dynamic testing  Left knee: Moderate effusion. Degenerative change appreciated over the medial joint space  Summary: Impingement right shoulder and left knee with degenerative changes  Ultrasound and interpretation by Clearance Coots, MD   Aspiration/Injection Procedure  Note [name] November 06, 1965  Procedure: Aspiration and Injection Indications: Left knee pain  Procedure Details Consent: Risks of procedure as well as the alternatives and risks of each were explained to the (patient/caregiver).  Consent for procedure obtained. Time Out: Verified patient identification, verified procedure, site/side was marked, verified correct patient position, special equipment/implants available, medications/allergies/relevent history reviewed, required imaging and test results available.  Performed.  The area was cleaned with iodine and alcohol swabs.    The left knee superior lateral suprapatellar pouch was injected using 3 cc of 1% lidocaine on a 25-gauge 1-1/2 inch needle.  An 18-gauge 1/2 inch needle was used to achieve aspiration.  The syringe was switched and a mixture containing 1 cc's of 40 mg Kenalog and 4 cc's of 0.25% bupivacaine was injected.  Ultrasound was used. Images were obtained in long views showing the injection.     A sterile dressing was applied.  Patient did tolerate procedure well.    ASSESSMENT & PLAN:   Calcific tendinitis Having calcific changes as well as impingement appreciated on exam. -Counseled on home exercise therapy and supportive care. -Could consider injection or further imaging. -Pursue shockwave therapy.  OA (osteoarthritis) of knee Acutely occurring.  Degenerative changes appreciated. -Counseled on home exercise therapy and supportive care. -Aspiration injection. -X-ray. -Could consider gel injection.

## 2021-10-19 NOTE — Patient Instructions (Signed)
Good to see you Please use ice as needed  I will call with the results.   Please send me a message in MyChart with any questions or updates.  Please see me back in 4 weeks.   --Dr. Raeford Razor

## 2021-10-21 ENCOUNTER — Telehealth: Payer: Self-pay | Admitting: Family Medicine

## 2021-10-21 NOTE — Telephone Encounter (Signed)
Informed of results.   Rosemarie Ax, MD [facility] 10/21/2021, 1:23 PM

## 2021-11-16 ENCOUNTER — Other Ambulatory Visit (HOSPITAL_BASED_OUTPATIENT_CLINIC_OR_DEPARTMENT_OTHER): Payer: Self-pay

## 2021-11-16 ENCOUNTER — Encounter: Payer: Self-pay | Admitting: Family Medicine

## 2021-11-16 ENCOUNTER — Ambulatory Visit: Payer: 59 | Admitting: Family Medicine

## 2021-11-16 ENCOUNTER — Ambulatory Visit: Payer: Self-pay

## 2021-11-16 VITALS — BP 114/78 | Ht 70.0 in | Wt 178.0 lb

## 2021-11-16 DIAGNOSIS — M6520 Calcific tendinitis, unspecified site: Secondary | ICD-10-CM

## 2021-11-16 DIAGNOSIS — M5412 Radiculopathy, cervical region: Secondary | ICD-10-CM | POA: Diagnosis not present

## 2021-11-16 MED ORDER — HYDROCODONE-ACETAMINOPHEN 5-325 MG PO TABS
1.0000 | ORAL_TABLET | Freq: Three times a day (TID) | ORAL | 0 refills | Status: DC | PRN
  Filled 2021-11-16: qty 15, 5d supply, fill #0

## 2021-11-16 MED ORDER — METHYLPREDNISOLONE ACETATE 40 MG/ML IJ SUSP
40.0000 mg | Freq: Once | INTRAMUSCULAR | Status: AC
  Administered 2021-11-16: 40 mg via INTRA_ARTICULAR

## 2021-11-16 NOTE — Progress Notes (Signed)
°  Bruce Hart - 56 y.o. male MRN [id number]  Date of birth: 02/02/1966  SUBJECTIVE:  Including CC & ROS.  No chief complaint on file.   Bruce Hart is a 56 y.o. male that is presenting with acute worsening of right shoulder pain.  Pain originates at the neck and goes down to his hand.  It is becoming severe over the past few weeks.    Review of Systems See HPI   HISTORY: Past Medical, Surgical, Social, and Family History Reviewed & Updated per EMR.   Pertinent Historical Findings include:  Past Medical History:  Diagnosis Date   Anemia    as teenager   Arthritis    "all over" (09/12/2017)   Arthritis, multiple joint involvement    Hepatitis 1969   Hep B?    History of kidney stones    PONV (postoperative nausea and vomiting)     Past Surgical History:  Procedure Laterality Date   JOINT REPLACEMENT     SHOULDER ARTHROSCOPY W/ ROTATOR CUFF REPAIR Left 1992   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   Procedure: RIGHT TOTAL HIP ARTHROPLASTY ANTERIOR APPROACH;  Surgeon: Rod Can, MD;  Location: [facility];  Service: Orthopedics;  Laterality: Right;  Needs RNFA     PHYSICAL EXAM:  VS: BP 114/78 (BP Location: Left Arm, Patient Position: Sitting)    Ht 5\' 10"  (1.778 m)    Wt 178 lb (80.7 kg)    BMI 25.54 kg/m  Physical Exam Gen: NAD, alert, cooperative with exam, well-appearing MSK:  Neurovascularly intact     Aspiration/Injection Procedure Note Bruce Hart 1966/07/15  Procedure: Injection Indications: Right shoulder pain  Procedure Details Consent: Risks of procedure as well as the alternatives and risks of each were explained to the (patient/caregiver).  Consent for procedure obtained. Time Out: Verified patient identification, verified procedure, site/side was marked, verified correct patient position, special equipment/implants available, medications/allergies/relevent history reviewed, required imaging and test  results available.  Performed.  The area was cleaned with iodine and alcohol swabs.    The right subacromial space was injected using 1 cc's of 40 mg Depo-Medrol and 4 cc's of 0.25% bupivacaine with a 21 1 1/2" needle.  Ultrasound was used. Images were obtained in long views showing the injection.     A sterile dressing was applied.  Patient did tolerate procedure well.     ASSESSMENT & PLAN:   Calcific tendinitis Acute on chronic in nature.  May have a component of radicular pain contributing to this area. -Counseled on home exercise therapy and supportive care. -Injection today. -Could consider shockwave  Cervical radiculopathy Having pain at the neck that extends down the fingers intermittently.  Pain is severe in nature. -Counseled on home exercise therapy and supportive care. -Norco. -Could consider imaging or physical therapy.

## 2021-11-16 NOTE — Assessment & Plan Note (Signed)
Having pain at the neck that extends down the fingers intermittently.  Pain is severe in nature. -Counseled on home exercise therapy and supportive care. -Norco. -Could consider imaging or physical therapy.

## 2021-11-16 NOTE — Patient Instructions (Signed)
Good to see you Please try heat  Please work on the exercises   Please send me a message in [place] with any questions or updates.  Please see me back in 2-4 weeks.   --Dr. Raeford Razor

## 2021-11-16 NOTE — Assessment & Plan Note (Signed)
Acute on chronic in nature.  May have a component of radicular pain contributing to this area. -Counseled on home exercise therapy and supportive care. -Injection today. -Could consider shockwave

## 2021-11-24 ENCOUNTER — Other Ambulatory Visit (HOSPITAL_COMMUNITY): Payer: Self-pay

## 2021-11-25 ENCOUNTER — Other Ambulatory Visit (HOSPITAL_COMMUNITY): Payer: Self-pay

## 2021-12-02 ENCOUNTER — Ambulatory Visit: Payer: 59 | Admitting: Family Medicine

## 2021-12-04 ENCOUNTER — Ambulatory Visit: Payer: Self-pay

## 2021-12-04 ENCOUNTER — Ambulatory Visit: Payer: 59 | Admitting: Family Medicine

## 2021-12-04 ENCOUNTER — Encounter: Payer: Self-pay | Admitting: Family Medicine

## 2021-12-04 VITALS — BP 108/61 | Ht 70.0 in | Wt 178.0 lb

## 2021-12-04 DIAGNOSIS — M25461 Effusion, right knee: Secondary | ICD-10-CM | POA: Diagnosis not present

## 2021-12-04 DIAGNOSIS — M25561 Pain in right knee: Secondary | ICD-10-CM

## 2021-12-04 MED ORDER — TRIAMCINOLONE ACETONIDE 40 MG/ML IJ SUSP
40.0000 mg | Freq: Once | INTRAMUSCULAR | Status: AC
  Administered 2021-12-04: 40 mg via INTRA_ARTICULAR

## 2021-12-04 NOTE — Patient Instructions (Signed)
Good to see you Please use ice as needed   Please send me a message in MyChart with any questions or updates.  Please see me back in 6-8 weeks.   --Dr. Fatisha Rabalais  

## 2021-12-04 NOTE — Progress Notes (Signed)
°  Bruce Hart - 56 y.o. male MRN [id number]  Date of birth: 1966-03-26  SUBJECTIVE:  Including CC & ROS.  No chief complaint on file.   Bruce Hart is a 56 y.o. male that is presenting with acute right knee pain.  The pain is occurring over the medial joint space.  Denies any specific injury.   Review of Systems See HPI   HISTORY: Past Medical, Surgical, Social, and Family History Reviewed & Updated per EMR.   Pertinent Historical Findings include:  Past Medical History:  Diagnosis Date   Anemia    as teenager   Arthritis    "all over" (09/12/2017)   Arthritis, multiple joint involvement    Hepatitis 1969   Hep B?    History of kidney stones    PONV (postoperative nausea and vomiting)     Past Surgical History:  Procedure Laterality Date   JOINT REPLACEMENT     SHOULDER ARTHROSCOPY W/ ROTATOR CUFF REPAIR Left 1992   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   Procedure: RIGHT TOTAL HIP ARTHROPLASTY ANTERIOR APPROACH;  Surgeon: Rod Can, MD;  Location: [place];  Service: Orthopedics;  Laterality: Right;  Needs RNFA     PHYSICAL EXAM:  VS: BP 108/61 (BP Location: Left Arm, Patient Position: Sitting)    Ht 5\' 10"  (1.778 m)    Wt 178 lb (80.7 kg)    BMI 25.54 kg/m  Physical Exam Gen: NAD, alert, cooperative with exam, well-appearing MSK:  Neurovascularly intact    Limited ultrasound: Right knee:  Moderate effusion. Normal-appearing quadricep and patellar tendon. Severe joint space changes medially with degenerative changes of the meniscus. Moderate changes of the meniscus laterally.  Summary: Knee effusion  Ultrasound and interpretation by Clearance Coots, MD   Aspiration/Injection Procedure Note [place] 1966-02-12  Procedure: Aspiration and Injection Indications: Right knee pain  Procedure Details Consent: Risks of procedure as well as the alternatives and risks of each were explained to the  (patient/caregiver).  Consent for procedure obtained. Time Out: Verified patient identification, verified procedure, site/side was marked, verified correct patient position, special equipment/implants available, medications/allergies/relevent history reviewed, required imaging and test results available.  Performed.  The area was cleaned with iodine and alcohol swabs.    The right knee superior lateral suprapatellar pouch was injected using 3 cc of 1% lidocaine on a 25-gauge 1-1/2 inch needle.  An 18-gauge 1-1/2 needle was used to achieve aspiration.  The syringe was switched and a mixture containing 1 cc's of 40 mg Kenalog and 4 cc's of 0.25% bupivacaine was injected.  Ultrasound was used. Images were obtained in long views showing the injection.    Amount of Fluid Aspirated:  66mL Character of Fluid: bloody and red colored Fluid was sent for: n/a  A sterile dressing was applied.  Patient did tolerate procedure well.    ASSESSMENT & PLAN:   Knee effusion, right Acutely occurring.  Not a true hemarthrosis but blood-tinged.  No injury. -Counseled on home exercise therapy and supportive care. -Aspiration and injection. -Could consider further imaging.

## 2021-12-04 NOTE — Assessment & Plan Note (Signed)
Acutely occurring.  Not a true hemarthrosis but blood-tinged.  No injury. -Counseled on home exercise therapy and supportive care. -Aspiration and injection. -Could consider further imaging.

## 2022-01-21 ENCOUNTER — Other Ambulatory Visit (HOSPITAL_COMMUNITY): Payer: Self-pay

## 2022-02-23 ENCOUNTER — Encounter: Payer: Self-pay | Admitting: Family Medicine

## 2022-02-23 ENCOUNTER — Ambulatory Visit: Payer: 59 | Admitting: Family Medicine

## 2022-02-23 VITALS — BP 110/72 | Ht 70.0 in | Wt 178.0 lb

## 2022-02-23 DIAGNOSIS — S46211A Strain of muscle, fascia and tendon of other parts of biceps, right arm, initial encounter: Secondary | ICD-10-CM | POA: Insufficient documentation

## 2022-02-23 NOTE — Assessment & Plan Note (Signed)
Acutely occurring.  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 ?-Counseled on home exercise therapy and supportive care. ?-Could consider shockwave therapy or physical therapy. ?

## 2022-02-23 NOTE — Patient Instructions (Signed)
Good to see you ?Please try heat  ?Please try the exercises when the pain has improved  ?We can try shockwave therapy   ?Please send me a message in MyChart with any questions or updates.  ?Please see me back in 4 weeks or as needed if improved.  ? ?--Dr. Raeford Razor ? ?

## 2022-02-23 NOTE — Progress Notes (Signed)
?  Bruce Hart - 56 y.o. male MRN [id number]  Date of birth: [birth year] ? ?SUBJECTIVE:  Including CC & ROS.  ?No chief complaint on file. ? ? ?Bruce Hart is a 56 y.o. male that is presenting with acute right shoulder pain.  He was playing basketball on Thursday and felt a pop in his upper arm.  He noticed a deformity and bruising in his bicep.  Pain has been mild in nature. ? ? ?Review of Systems ?See HPI  ? ?HISTORY: Past Medical, Surgical, Social, and Family History Reviewed & Updated per EMR.   ?Pertinent Historical Findings include: ? ?Past Medical History:  ?Diagnosis Date  ? Anemia   ? as teenager  ? Arthritis   ? "all over" (09/12/2017)  ? Arthritis, multiple joint involvement   ? Hepatitis 1969  ? Hep B?   ? History of kidney stones   ? PONV (postoperative nausea and vomiting)   ? ? ?Past Surgical History:  ?Procedure Laterality Date  ? JOINT REPLACEMENT    ? SHOULDER ARTHROSCOPY W/ ROTATOR CUFF REPAIR Left 1992  ? 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  ? 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  ? Procedure: RIGHT TOTAL HIP ARTHROPLASTY ANTERIOR APPROACH;  Surgeon: Rod Can, MD;  Location: [facility];  Service: Orthopedics;  Laterality: Right;  Needs RNFA  ? ? ? ?PHYSICAL EXAM:  ?VS: BP 110/72 (BP Location: Left Arm, Patient Position: Sitting)   Ht '5\' 10"'$  (1.778 m)   Wt 178 lb (80.7 kg)   BMI 25.54 kg/m?  ?Physical Exam ?Gen: NAD, alert, cooperative with exam, well-appearing ?MSK:  ?Neurovascularly intact   ? ?Limited ultrasound: Right shoulder: ? ?Proximal tendon is absent in the bicipital groove with retraction and effusion noted down into the arm ? ?Summary: Proximal bicipital tendon rupture ? ?Ultrasound and interpretation by Clearance Coots, MD ? ? ? ?ASSESSMENT & PLAN:  ? ?Biceps rupture, proximal, right, initial encounter ?Acutely occurring.  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 ?-Counseled on home exercise therapy and supportive care. ?-Could  consider shockwave therapy or physical therapy. ? ? ? ? ?

## 2022-03-02 ENCOUNTER — Other Ambulatory Visit (HOSPITAL_COMMUNITY): Payer: Self-pay

## 2022-03-27 ENCOUNTER — Ambulatory Visit
Admission: EM | Admit: 2022-03-27 | Discharge: 2022-03-27 | Disposition: A | Discharge status: Home / Self Care | Payer: 59 | Attending: Emergency Medicine | Admitting: Emergency Medicine

## 2022-03-27 ENCOUNTER — Encounter: Payer: Self-pay | Admitting: Emergency Medicine

## 2022-03-27 DIAGNOSIS — N390 Urinary tract infection, site not specified: Secondary | ICD-10-CM | POA: Insufficient documentation

## 2022-03-27 DIAGNOSIS — R319 Hematuria, unspecified: Secondary | ICD-10-CM | POA: Diagnosis present

## 2022-03-27 LAB — POCT URINALYSIS DIP (MANUAL ENTRY)
Glucose, UA: 100 mg/dL — AB
Nitrite, UA: POSITIVE — AB
Protein Ur, POC: >=300 mg/dL — AB
Spec Grav, UA: >=1.03 — AB (ref 1.010–1.025)
Urobilinogen, UA: 1 E.U./dL
pH, UA: 5 (ref 5.0–8.0)

## 2022-03-27 MED ORDER — CEFTRIAXONE SODIUM 1 G IJ SOLR
1.0000 g | Freq: Once | INTRAMUSCULAR | Status: AC
  Administered 2022-03-27: 1 g via INTRAMUSCULAR

## 2022-03-27 MED ORDER — LEVOFLOXACIN 750 MG PO TABS: 750.0000 mg | ORAL_TABLET | Freq: Every day | ORAL | 0 refills | Status: AC

## 2022-03-27 NOTE — ED Provider Notes (Signed)
[facility]    CSN: [id number] Arrival date & time: 03/27/22  1224    HISTORY   Chief Complaint  Patient presents with   Dysuria   Hematuria   HPI Bruce Hart is a 56 y.o. male. Pt here with a 2 day hx of increased urinary frequency and presumed hematuria that started this morning. Low back pain noted but states this could be musculoskeletal.  Patient denies burning with urination, no exposure to STD, scrotal pain, scrotal swelling, testicular pain, testicular swelling, fever, aches, chills, urine malodor.  The history is provided by the patient.   Past Medical History:  Diagnosis Date   Anemia    as teenager   Arthritis    "all over" (09/12/2017)   Arthritis, multiple joint involvement    Hepatitis 1969   Hep B?    History of kidney stones    PONV (postoperative nausea and vomiting)    Patient Active Problem List   Diagnosis Date Noted   Biceps rupture, proximal, right, initial encounter 02/23/2022   Knee effusion, right 12/04/2021   Cervical radiculopathy 11/16/2021   Capsulitis of right shoulder 05/16/2020   Mid back pain 04/27/2019   Left knee pain 05/12/2017   Atypical chest pain 04/06/2017   Hyperlipidemia 04/06/2017   Right wrist injury 09/29/2015   Calcific tendinitis 07/23/2013   Strain of right Achilles tendon 07/23/2013   Right hip pain 08/15/2012   Chest wall mass 08/15/2012   TROCHANTERIC BURSITIS 04/09/2010   Osteoarthritis of right hip 06/07/2008   OA (osteoarthritis) of knee 06/07/2008   Past Surgical History:  Procedure Laterality Date   JOINT REPLACEMENT     SHOULDER ARTHROSCOPY W/ ROTATOR CUFF REPAIR Left 1992   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   Procedure: RIGHT TOTAL HIP ARTHROPLASTY ANTERIOR APPROACH;  Surgeon: Rod Can, MD;  Location: [facility];  Service: Orthopedics;  Laterality: Right;  Needs RNFA    Home Medications    Prior to Admission medications   Medication  Sig Start Date End Date Taking? Authorizing Provider  levofloxacin (LEVAQUIN) 750 MG tablet Take 1 tablet (750 mg total) by mouth daily for 5 days. 03/27/22 04/01/22 Yes Lynden Oxford Scales, PA-C  tamsulosin (FLOMAX) 0.4 MG CAPS capsule Take 1 capsule by mouth once daily 09/10/21  Yes    Family History Family History  Problem Relation Age of Onset   Hyperlipidemia Mother    Hypertension Mother    Sudden death Father    Diabetes Father    Hyperlipidemia Father    Hypertension Father    Social History Social History   Tobacco Use   Smoking status: Never   Smokeless tobacco: Never  Vaping Use   Vaping Use: Never used  Substance Use Topics   Alcohol use: Yes    Comment: 09/12/2017 "couple drinks/year"   Drug use: No   Allergies   Tramadol  Review of Systems Review of Systems Pertinent findings noted in history of present illness.   Physical Exam Triage Vital Signs ED Triage Vitals  Enc Vitals Group     BP 08/07/21 0827 (!) 147/82     Pulse Rate 08/07/21 0827 72     Resp 08/07/21 0827 18     Temp 08/07/21 0827 98.3 F (36.8 C)     Temp Source 08/07/21 0827 Oral     SpO2 08/07/21 0827 98 %     Weight --      Height --  Head Circumference --      Peak Flow --      Pain Score 08/07/21 0826 5     Pain Loc --      Pain Edu? --      Excl. in [place]? --   No data found.  Updated Vital Signs BP 129/77   Pulse 79   Temp 98.1 F (36.7 C)   Resp 18   SpO2 96%   Physical Exam Vitals and nursing note reviewed.  Constitutional:      General: He is not in acute distress.    Appearance: Normal appearance. He is not ill-appearing.  HENT:     Head: Normocephalic and atraumatic.  Eyes:     General: Lids are normal.        Right eye: No discharge.        Left eye: No discharge.     Extraocular Movements: Extraocular movements intact.     Conjunctiva/sclera: Conjunctivae normal.     Right eye: Right conjunctiva is not injected.     Left eye: Left conjunctiva is not  injected.  Neck:     Trachea: Trachea and phonation normal.  Cardiovascular:     Rate and Rhythm: Normal rate and regular rhythm.     Pulses: Normal pulses.     Heart sounds: Normal heart sounds. No murmur heard.    No friction rub. No gallop.  Pulmonary:     Effort: Pulmonary effort is normal. No accessory muscle usage, prolonged expiration or respiratory distress.     Breath sounds: Normal breath sounds. No stridor, decreased air movement or transmitted upper airway sounds. No decreased breath sounds, wheezing, rhonchi or rales.  Chest:     Chest wall: No tenderness.  Abdominal:     General: Abdomen is flat. Bowel sounds are normal. There is no distension.     Palpations: Abdomen is soft.     Tenderness: There is no abdominal tenderness. There is no right CVA tenderness or left CVA tenderness.     Hernia: No hernia is present.  Musculoskeletal:        General: Normal range of motion.     Cervical back: Normal range of motion and neck supple. Normal range of motion.  Lymphadenopathy:     Cervical: No cervical adenopathy.  Skin:    General: Skin is warm and dry.     Findings: No erythema or rash.  Neurological:     General: No focal deficit present.     Mental Status: He is alert and oriented to person, place, and time.  Psychiatric:        Mood and Affect: Mood normal.        Behavior: Behavior normal.     Visual Acuity Right Eye Distance:   Left Eye Distance:   Bilateral Distance:    Right Eye Near:   Left Eye Near:    Bilateral Near:     UC Couse / Diagnostics / Procedures:    EKG  Radiology No results found.  Procedures Procedures (including critical care time)  UC Diagnoses / Final Clinical Impressions(s)   I have reviewed the triage vital signs and the nursing notes.  Pertinent labs & imaging results that were available during my care of the patient were reviewed by me and considered in my medical decision making (see chart for details).    Final  diagnoses:  Hematuria, unspecified type  Acute UTI (urinary tract infection)     Urine dip today was positive.  Urine culture will be performed per our protocol.   Patient was advised to begin antibiotics now due to findings on urine dip. Patient advised that they will be contacted with results and that adjustments to treatment will be provided as indicated based on the results.   Patient was advised of possibility that urine culture results may be negative if sample provided was obtained late in the day causing urine to be more diluted.  Patient was advised that if antibiotics were effective after the first 24 to 36 hours, despite negative urine culture result, it is recommended that they complete the full course as prescribed.   Return precautions advised.  ED Prescriptions     Medication Sig Dispense Auth. Provider   levofloxacin (LEVAQUIN) 750 MG tablet Take 1 tablet (750 mg total) by mouth daily for 5 days. 5 tablet Lynden Oxford Scales, PA-C      PDMP not reviewed this encounter.  Pending results:  Labs Reviewed  POCT URINALYSIS DIP (MANUAL ENTRY) - Abnormal; Notable for the following components:      Result Value   Color, UA brown (*)    Clarity, UA cloudy (*)    Glucose, UA =100 (*)    Bilirubin, UA small (*)    Ketones, POC UA trace (5) (*)    Spec Grav, UA >=1.030 (*)    Blood, UA large (*)    Protein Ur, POC >=300 (*)    Nitrite, UA Positive (*)    Leukocytes, UA Small (1+) (*)    All other components within normal limits  URINE CULTURE    Medications Ordered in UC: Medications  cefTRIAXone (ROCEPHIN) injection 1 g (has no administration in time range)    Disposition Upon Discharge:  Condition: stable for discharge home  Patient presented with concern for an acute illness with associated systemic symptoms and significant discomfort requiring urgent management. In my opinion, this is a condition that a prudent lay person (someone who possesses an average  knowledge of health and medicine) may potentially expect to result in complications if not addressed urgently such as respiratory distress, impairment of bodily function or dysfunction of bodily organs.   As such, the patient has been evaluated and assessed, work-up was performed and treatment was provided in alignment with urgent care protocols and evidence based medicine.  Patient/parent/caregiver has been advised that the patient may require follow up for further testing and/or treatment if the symptoms continue in spite of treatment, as clinically indicated and appropriate.  Routine symptom specific, illness specific and/or disease specific instructions were discussed with the patient and/or caregiver at length.  Prevention strategies for avoiding STD exposure were also discussed.  The patient will follow up with their current PCP if and as advised. If the patient does not currently have a PCP we will assist them in obtaining one.   The patient may need specialty follow up if the symptoms continue, in spite of conservative treatment and management, for further workup, evaluation, consultation and treatment as clinically indicated and appropriate.  Patient/parent/caregiver verbalized understanding and agreement of plan as discussed.  All questions were addressed during visit.  Please see discharge instructions below for further details of plan.  Discharge Instructions:   Discharge Instructions      To treat you for urinary tract infection, you received an injection of ceftriaxone 1 g during your visit today.  Today, please pick up and begin your prescription for levofloxacin 750 mg.  Please take 1 tablet daily at roughly the same  time of day.  It would be best if you could begin this prescription today.  Please get in touch with your urologist first thing Monday morning to let them know about this visit today to see if they have any further recommendations.  Thank you for visiting urgent  care today.      This office note has been dictated using Museum/gallery curator.  Unfortunately, and despite my best efforts, this method of dictation can sometimes lead to occasional typographical or grammatical errors.  I apologize in advance if this occurs.      Lynden Oxford Scales, PA-C 03/27/22 1304

## 2022-03-27 NOTE — ED Triage Notes (Signed)
Pt here with a 2 day hx of increased urinary frequency and hematuria that started this morning. Low back pain noted.

## 2022-03-27 NOTE — Discharge Instructions (Signed)
To treat you for urinary tract infection, you received an injection of ceftriaxone 1 g during your visit today.  Today, please pick up and begin your prescription for levofloxacin 750 mg.  Please take 1 tablet daily at roughly the same time of day.  It would be best if you could begin this prescription today.  Please get in touch with your urologist first thing Monday morning to let them know about this visit today to see if they have any further recommendations.  Thank you for visiting urgent care today.

## 2022-03-28 LAB — URINE CULTURE: Culture: <10000 — AB

## 2022-04-07 ENCOUNTER — Other Ambulatory Visit (HOSPITAL_COMMUNITY): Payer: Self-pay

## 2022-04-07 ENCOUNTER — Other Ambulatory Visit (HOSPITAL_BASED_OUTPATIENT_CLINIC_OR_DEPARTMENT_OTHER): Payer: Self-pay

## 2022-05-24 ENCOUNTER — Other Ambulatory Visit (HOSPITAL_COMMUNITY): Payer: Self-pay

## 2022-05-24 ENCOUNTER — Other Ambulatory Visit (HOSPITAL_BASED_OUTPATIENT_CLINIC_OR_DEPARTMENT_OTHER): Payer: Self-pay

## 2022-07-08 ENCOUNTER — Other Ambulatory Visit (HOSPITAL_BASED_OUTPATIENT_CLINIC_OR_DEPARTMENT_OTHER): Payer: Self-pay

## 2022-07-09 ENCOUNTER — Other Ambulatory Visit (HOSPITAL_BASED_OUTPATIENT_CLINIC_OR_DEPARTMENT_OTHER): Payer: Self-pay

## 2022-07-09 ENCOUNTER — Other Ambulatory Visit (HOSPITAL_COMMUNITY): Payer: Self-pay

## 2022-07-10 IMAGING — DX DG KNEE COMPLETE 4+V*L*
4 series · 4 of 4 positions shown · non-contrast
Comparison: None.

CLINICAL DATA: Left knee pain.

EXAM:
LEFT KNEE - COMPLETE 4+ VIEW

[knee ap]
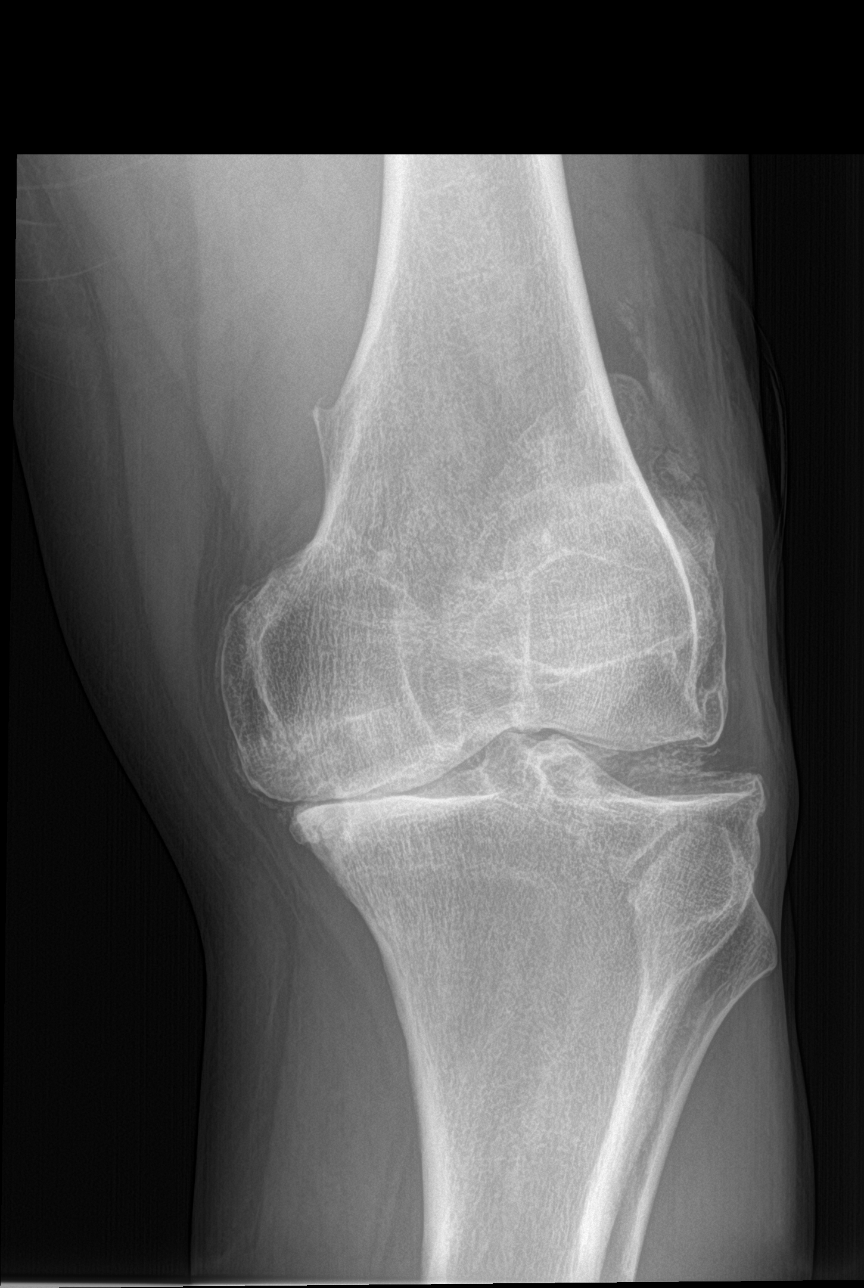

[tunnel]
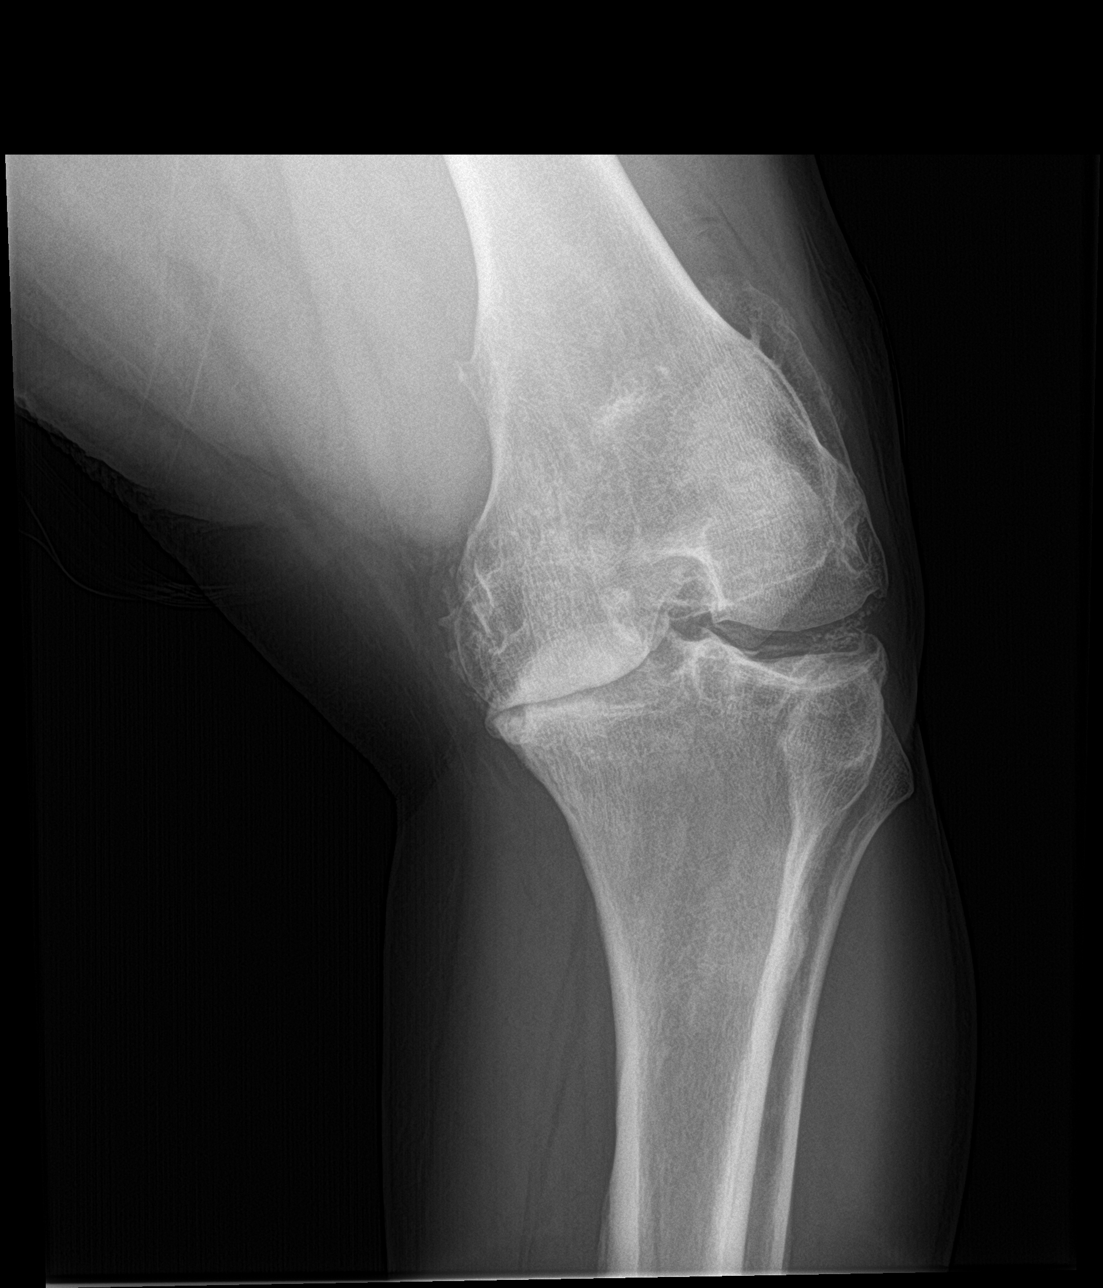

[knee lat]
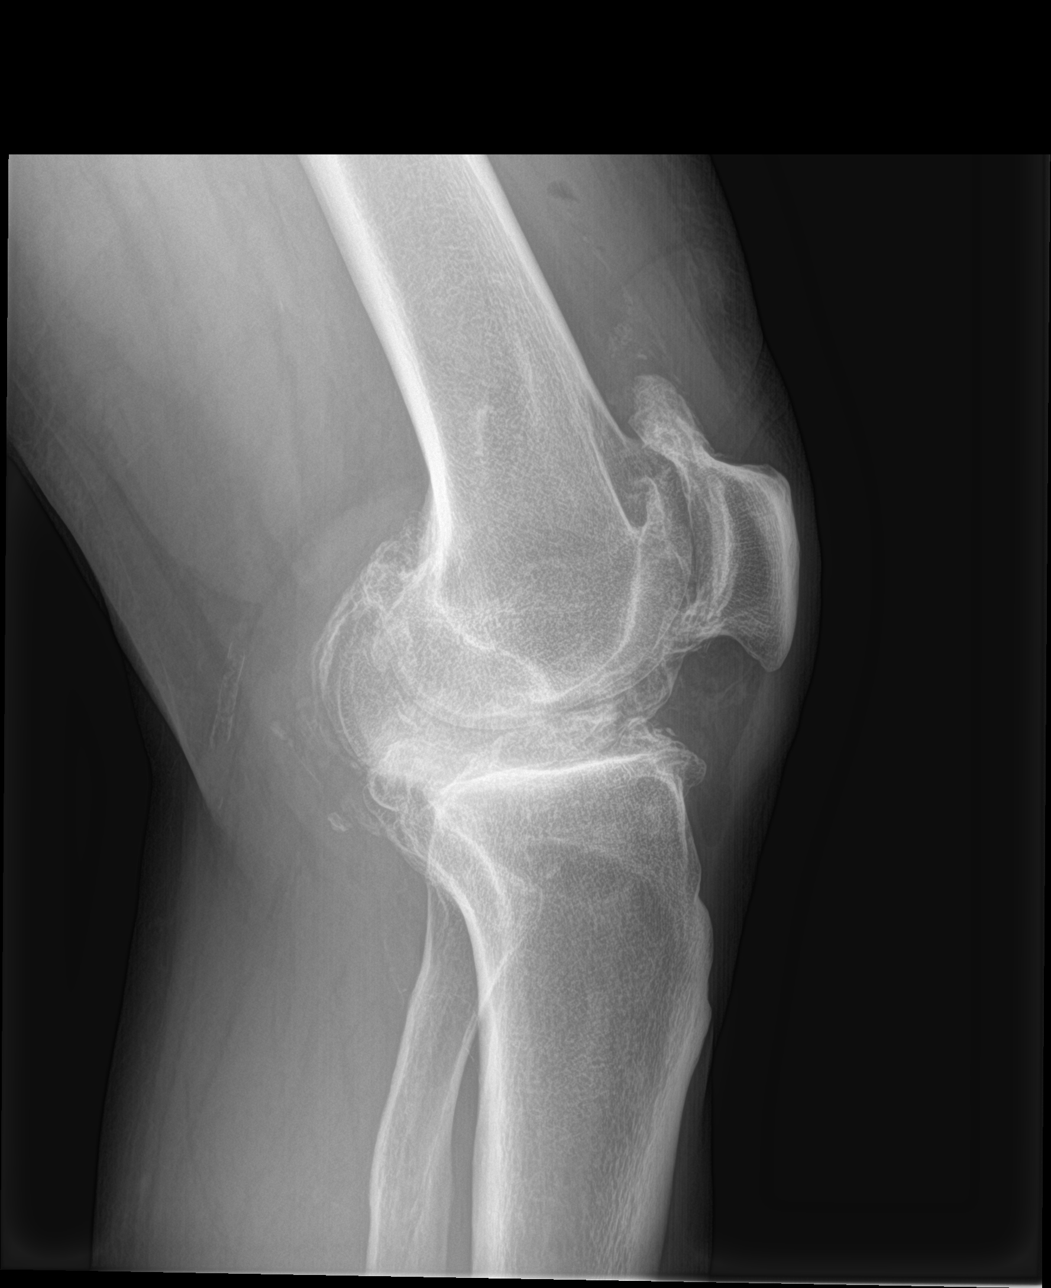

[knee sunrise]
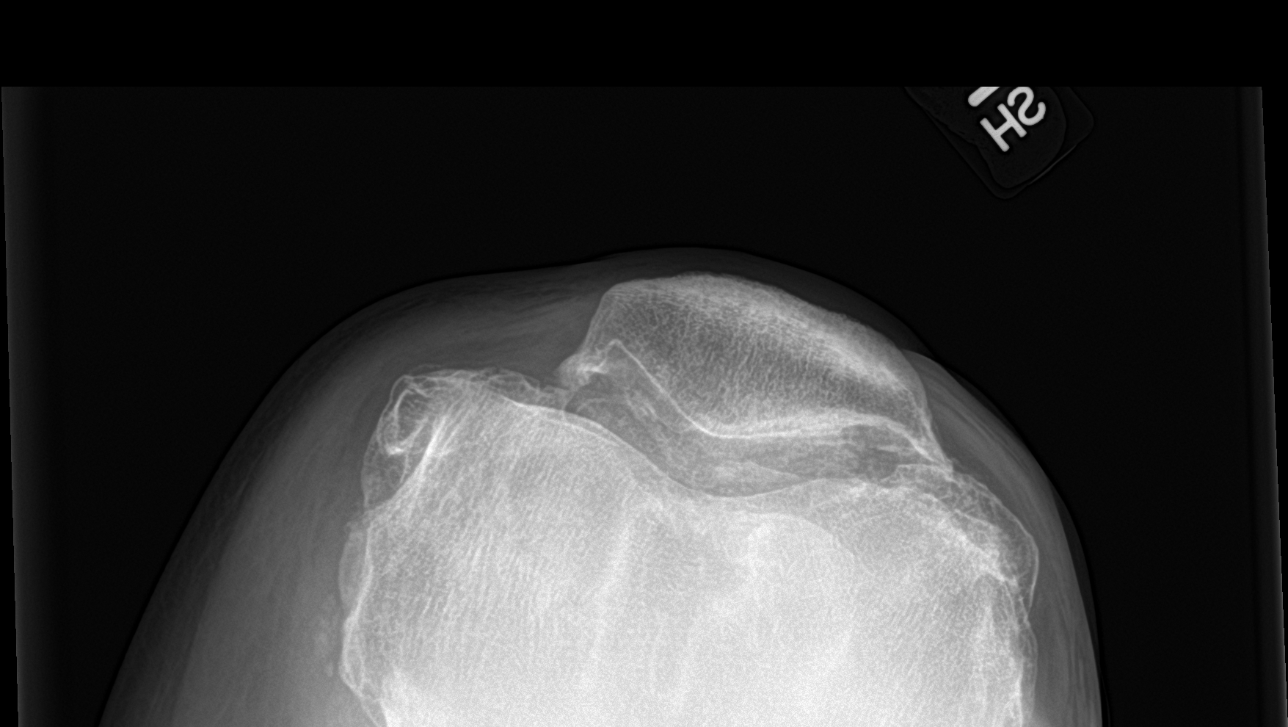

[4 of 4 positions shown; findings below may reference images not displayed]

FINDINGS: There is no acute fracture or dislocation. There is severe
osteoarthritic changes with tricompartmental narrowing and severe
spurring. There is associated mild varus angulation. No significant
joint effusion. The soft tissues are unremarkable.
IMPRESSION: 1. No acute fracture or dislocation.
2. Severe osteoarthritis.

## 2022-08-09 ENCOUNTER — Other Ambulatory Visit (HOSPITAL_COMMUNITY): Payer: Self-pay

## 2022-09-03 ENCOUNTER — Other Ambulatory Visit (HOSPITAL_BASED_OUTPATIENT_CLINIC_OR_DEPARTMENT_OTHER): Payer: Self-pay

## 2022-10-27 ENCOUNTER — Other Ambulatory Visit (HOSPITAL_COMMUNITY): Payer: Self-pay

## 2022-10-28 ENCOUNTER — Other Ambulatory Visit (HOSPITAL_COMMUNITY): Payer: Self-pay

## 2022-10-28 MED ORDER — TAMSULOSIN HCL 0.4 MG PO CAPS
ORAL_CAPSULE | ORAL | 3 refills | Status: AC
  Filled 2022-10-28: qty 30, 30d supply, fill #0
  Filled 2022-12-09: qty 30, 30d supply, fill #1
  Filled 2023-01-18: qty 30, 30d supply, fill #2
  Filled 2023-02-21: qty 30, 30d supply, fill #3
  Filled 2023-03-23: qty 30, 30d supply, fill #4
  Filled 2023-04-19: qty 30, 30d supply, fill #5
  Filled 2023-06-15: qty 30, 30d supply, fill #6
  Filled 2023-08-14: qty 30, 30d supply, fill #7
  Filled 2023-09-12: qty 30, 30d supply, fill #8
  Filled 2023-10-10: qty 30, 30d supply, fill #9

## 2022-10-29 ENCOUNTER — Other Ambulatory Visit (HOSPITAL_COMMUNITY): Payer: Self-pay

## 2023-01-12 ENCOUNTER — Other Ambulatory Visit (HOSPITAL_BASED_OUTPATIENT_CLINIC_OR_DEPARTMENT_OTHER): Payer: Self-pay

## 2023-01-19 ENCOUNTER — Other Ambulatory Visit (HOSPITAL_COMMUNITY): Payer: Self-pay

## 2023-01-24 ENCOUNTER — Encounter: Payer: Self-pay | Admitting: *Deleted

## 2023-02-16 ENCOUNTER — Ambulatory Visit: Payer: 59 | Admitting: Family Medicine

## 2023-02-18 ENCOUNTER — Encounter: Payer: Self-pay | Admitting: Family Medicine

## 2023-02-18 ENCOUNTER — Other Ambulatory Visit: Payer: Self-pay

## 2023-02-18 ENCOUNTER — Ambulatory Visit: Payer: 59 | Admitting: Family Medicine

## 2023-02-18 VITALS — BP 106/68 | Ht 70.0 in | Wt 175.0 lb

## 2023-02-18 DIAGNOSIS — M1712 Unilateral primary osteoarthritis, left knee: Secondary | ICD-10-CM

## 2023-02-18 MED ORDER — TRIAMCINOLONE ACETONIDE 40 MG/ML IJ SUSP
40.0000 mg | Freq: Once | INTRAMUSCULAR | Status: AC
  Administered 2023-02-18: 40 mg via INTRA_ARTICULAR

## 2023-02-18 NOTE — Progress Notes (Signed)
  Bruce Hart - 57 y.o. male MRN [id number]  Date of birth: 1966/08/10  SUBJECTIVE:  Including CC & ROS.  No chief complaint on file.   Bruce Hart is a 57 y.o. male that is presenting with acute on chronic left knee pain.  Pain occurred last week.  Denies any injury inciting event.  No new or different activities.    Review of Systems See HPI   HISTORY: Past Medical, Surgical, Social, and Family History Reviewed & Updated per EMR.   Pertinent Historical Findings include:  Past Medical History:  Diagnosis Date   Anemia    as teenager   Arthritis    "all over" (09/12/2017)   Arthritis, multiple joint involvement    Hepatitis 1969   Hep B?    History of kidney stones    PONV (postoperative nausea and vomiting)     Past Surgical History:  Procedure Laterality Date   JOINT REPLACEMENT     SHOULDER ARTHROSCOPY W/ ROTATOR CUFF REPAIR Left 1992   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   Procedure: RIGHT TOTAL HIP ARTHROPLASTY ANTERIOR APPROACH;  Surgeon: Samson Frederic, MD;  Location: [facility] OR;  Service: Orthopedics;  Laterality: Right;  Needs RNFA     PHYSICAL EXAM:  VS: BP 106/68 (BP Location: Left Arm, Patient Position: Sitting)   Ht 5\' 10"  (1.778 m)   Wt 175 lb (79.4 kg)   BMI 25.11 kg/m  Physical Exam Gen: NAD, alert, cooperative with exam, well-appearing MSK:  Neurovascularly intact    Limited ultrasound: Left knee pain:  Moderate effusion. Calcific change appreciated within the suprapatellar pouch. Normal-appearing quadricep patellar tendon. Severe joint space narrowing in the medial compartment with hyperemia. Synovitis appreciated laterally  Summary: Findings consistent with severe osteoarthritis  Ultrasound and interpretation by Clare Gandy, MD  Aspiration/Injection Procedure Note [facility] 1965/11/05  Procedure: Injection Indications: Left knee pain  Procedure Details Consent: Risks of  procedure as well as the alternatives and risks of each were explained to the (patient/caregiver).  Consent for procedure obtained. Time Out: Verified patient identification, verified procedure, site/side was marked, verified correct patient position, special equipment/implants available, medications/allergies/relevent history reviewed, required imaging and test results available.  Performed.  The area was cleaned with iodine and alcohol swabs.    The left knee superior lateral suprapatellar pouch was injected using 4 cc of 1% lidocaine and 0.4 cc of 8.4% sodium bicarbonate on a 22-gauge 1-1/2 inch needle.  An 18-gauge 1-1/2 inch needle was used to achieve aspiration.  The syringe was switched to mixture containing 1 cc's of 40 mg Kenalog and 4 cc's of 0.25% bupivacaine was injected.  Ultrasound was used. Images were obtained in long views showing the injection.    Amount of Fluid Aspirated:  26mL Character of Fluid: straw colored and cloudy Fluid was sent [id number] count and crystal identification  A sterile dressing was applied.  Patient did tolerate procedure well.     ASSESSMENT & PLAN:   OA (osteoarthritis) of knee Acute on chronic in nature.  Exacerbation of underlying degenerative changes.  May have component of synovitis as well.  Seems less likely for gout. -Counseled on home exercise therapy and supportive care. -Aspiration and injection. -Synovial fluid analysis. - could consider gel injection or zilretta

## 2023-02-18 NOTE — Patient Instructions (Signed)
Good to see you Please use ice as needed   Please send me a message in MyChart with any questions or updates.  Please see me back as needed.   --Dr. Hays Dunnigan  

## 2023-02-18 NOTE — Addendum Note (Signed)
Addended by: Merrilyn Puma on: 02/18/2023 10:32 AM   Modules accepted: Orders

## 2023-02-18 NOTE — Assessment & Plan Note (Signed)
Acute on chronic in nature.  Exacerbation of underlying degenerative changes.  May have component of synovitis as well.  Seems less likely for gout. -Counseled on home exercise therapy and supportive care. -Aspiration and injection. -Synovial fluid analysis. - could consider gel injection or zilretta

## 2023-02-19 LAB — SYNOVIAL FLUID ANALYSIS, COMPLETE
Basophils, %: 0 %
Eosinophils-Synovial: 0 % (ref 0–2)
Lymphocytes-Synovial Fld: 65 % (ref 0–74)
Monocyte/Macrophage: 5 % (ref 0–69)
Neutrophil, Synovial: 26 % — ABNORMAL HIGH (ref 0–24)
Synoviocytes, %: 4 % (ref 0–15)
WBC, Synovial: 104 cells/uL (ref <150)

## 2023-02-28 ENCOUNTER — Telehealth: Payer: Self-pay | Admitting: Family Medicine

## 2023-02-28 NOTE — Telephone Encounter (Signed)
Left VM for patient. If he calls back please have him speak with a nurse/CMA and inform that his fluid analysis was normal.   If any questions then please take the best time and phone number to call and I will try to call him back.   Myra Rude, MD [facility] 02/28/2023, 2:44 PM

## 2023-03-01 NOTE — Telephone Encounter (Signed)
Pt informed of below.  

## 2023-04-21 ENCOUNTER — Other Ambulatory Visit (HOSPITAL_COMMUNITY): Payer: Self-pay

## 2023-06-07 ENCOUNTER — Telehealth: Payer: Self-pay

## 2023-06-07 NOTE — Telephone Encounter (Signed)
Pt cancelled appt via mychart " feeling better"

## 2023-06-07 NOTE — Telephone Encounter (Signed)
Pt called yesterday w/ chest pain, was triaged to ED, I don't see that he went, he is scheduled w/ PCP on 8/30.   S drive was down, so delay in getting these triage notes.

## 2023-06-07 NOTE — Telephone Encounter (Signed)
Chief Complaint CHEST PAIN - pain, pressure, heaviness or tightness Reason for Call Symptomatic / Request for Health Information Initial Comment Caller states he was transferred due to chest pain. Translation No Nurse Assessment Nurse: Mort Sawyers, RN, Cindy Date/Time (Eastern Time): 06/06/2023 1:54:09 PM Confirm and document reason for call. If symptomatic, describe symptoms. ---Caller has chest discomfort that comes and goes starting yesterday Does the patient have any new or worsening symptoms? ---Yes Will a triage be completed? ---Yes Related visit to physician within the last 2 weeks? ---No Does the PT have any chronic conditions? (i.e. diabetes, asthma, this includes High risk factors for pregnancy, etc.) ---No Is this a behavioral health or substance abuse call? ---No Guidelines Guideline Title Affirmed Question Affirmed Notes Nurse Date/Time (Eastern Time) Chest Pain [1] Chest pain (or "angina") comes and goes AND [2] is happening more often (increasing in frequency) or getting worse (increasing in severity) (Exception: Chest pains that last only a few seconds.) Mort Sawyers, RN, Arline Asp 06/06/2023 1:54:47 PM PLEASE NOTE: All timestamps contained within this report are represented as [place] Standard Time. CONFIDENTIALTY NOTICE: This fax transmission is intended only for the addressee. It contains information that is legally privileged, confidential or otherwise protected from use or disclosure. If you are not the intended recipient, you are strictly prohibited from reviewing, disclosing, copying using or disseminating any of this information or taking any action in reliance on or regarding this information. If you have received this fax in error, please notify us immediately by telephone so that we can arrange for its return to [place]. Phone: [phone], Toll-Free: [phone], Fax: [phone] Page: 2 of 2 Call Id: [id number] Disp. Time Lamount Cohen Time) Disposition Final  User 06/06/2023 1:52:31 PM Send to Urgent Queue Cephas Darby 06/06/2023 1:59:22 PM Go to ED Now Yes Mort Sawyers, RN, Arline Asp Final Disposition 06/06/2023 1:59:22 PM Go to ED Now Yes Mort Sawyers, RN, Ave Filter Disagree/Comply Comply Caller Understands Yes PreDisposition InappropriateToAsk Care Advice Given Per Guideline GO TO ED NOW: * Leave now. Drive carefully. ANOTHER ADULT SHOULD DRIVE: * It is better and safer if another adult drives instead of you. BRING MEDICINES: * Bring a list of your current medicines when you go to the Emergency Department (ER). NOTHING BY MOUTH: * Do not eat or drink anything for now. CALL EMS IF: * Severe difficulty breathing occurs * Passes out or becomes too weak to stand * You become worse CARE ADVICE given per Chest Pain (Adult) guideline. Referrals GO TO FACILITY UNDECIDED

## 2023-06-07 NOTE — Telephone Encounter (Signed)
Pt called and lvm to return call in regards to chest pain and to move appt sooner

## 2023-06-08 ENCOUNTER — Other Ambulatory Visit (HOSPITAL_BASED_OUTPATIENT_CLINIC_OR_DEPARTMENT_OTHER): Payer: Self-pay

## 2023-06-08 ENCOUNTER — Other Ambulatory Visit: Payer: Self-pay

## 2023-06-08 MED ORDER — TACROLIMUS 0.1 % EX OINT
TOPICAL_OINTMENT | CUTANEOUS | 1 refills | Status: AC
  Filled 2023-06-08: qty 30, 30d supply, fill #0

## 2023-06-09 ENCOUNTER — Other Ambulatory Visit (HOSPITAL_BASED_OUTPATIENT_CLINIC_OR_DEPARTMENT_OTHER): Payer: Self-pay

## 2023-06-10 ENCOUNTER — Ambulatory Visit: Payer: 59 | Admitting: Medical

## 2023-06-16 ENCOUNTER — Ambulatory Visit (INDEPENDENT_AMBULATORY_CARE_PROVIDER_SITE_OTHER): Payer: 59 | Admitting: Medical

## 2023-06-16 VITALS — BP 125/70 | HR 57 | Temp 98.6°F | Resp 16 | Ht 70.0 in | Wt 189.0 lb

## 2023-06-16 DIAGNOSIS — Z113 Encounter for screening for infections with a predominantly sexual mode of transmission: Secondary | ICD-10-CM | POA: Diagnosis not present

## 2023-06-16 DIAGNOSIS — Z8249 Family history of ischemic heart disease and other diseases of the circulatory system: Secondary | ICD-10-CM

## 2023-06-16 DIAGNOSIS — Z23 Encounter for immunization: Secondary | ICD-10-CM

## 2023-06-16 DIAGNOSIS — R17 Unspecified jaundice: Secondary | ICD-10-CM

## 2023-06-16 DIAGNOSIS — Z125 Encounter for screening for malignant neoplasm of prostate: Secondary | ICD-10-CM

## 2023-06-16 DIAGNOSIS — Z0000 Encounter for general adult medical examination without abnormal findings: Secondary | ICD-10-CM

## 2023-06-16 DIAGNOSIS — Z1211 Encounter for screening for malignant neoplasm of colon: Secondary | ICD-10-CM

## 2023-06-16 DIAGNOSIS — E785 Hyperlipidemia, unspecified: Secondary | ICD-10-CM

## 2023-06-16 LAB — CBC WITH DIFFERENTIAL/PLATELET
Basophils Absolute: 0 10*3/uL (ref 0.0–0.1)
Basophils Relative: 0.8 % (ref 0.0–3.0)
Eosinophils Absolute: 0.2 10*3/uL (ref 0.0–0.7)
Eosinophils Relative: 5 % (ref 0.0–5.0)
HCT: 44.9 % (ref 39.0–52.0)
Hemoglobin: 14.7 g/dL (ref 13.0–17.0)
Lymphocytes Relative: 29 % (ref 12.0–46.0)
Lymphs Abs: 0.9 10*3/uL (ref 0.7–4.0)
MCHC: 32.7 g/dL (ref 30.0–36.0)
MCV: 89.7 fl (ref 78.0–100.0)
Monocytes Absolute: 0.4 10*3/uL (ref 0.1–1.0)
Monocytes Relative: 11.8 % (ref 3.0–12.0)
Neutro Abs: 1.7 10*3/uL (ref 1.4–7.7)
Neutrophils Relative %: 53.4 % (ref 43.0–77.0)
Platelets: 178 10*3/uL (ref 150.0–400.0)
RBC: 5.01 Mil/uL (ref 4.22–5.81)
RDW: 14.3 % (ref 11.5–15.5)
WBC: 3.3 10*3/uL — ABNORMAL LOW (ref 4.0–10.5)

## 2023-06-16 LAB — LIPID PANEL
Cholesterol: 232 mg/dL — ABNORMAL HIGH (ref 0–200)
HDL: 74.3 mg/dL (ref >39.00)
LDL Cholesterol: 137 mg/dL — ABNORMAL HIGH (ref 0–99)
NonHDL: 157.94
Total CHOL/HDL Ratio: 3
Triglycerides: 106 mg/dL (ref 0.0–149.0)
VLDL: 21.2 mg/dL (ref 0.0–40.0)

## 2023-06-16 LAB — COMPREHENSIVE METABOLIC PANEL
ALT: 25 U/L (ref 0–53)
AST: 26 U/L (ref 0–37)
Albumin: 4.3 g/dL (ref 3.5–5.2)
Alkaline Phosphatase: 87 U/L (ref 39–117)
BUN: 18 mg/dL (ref 6–23)
CO2: 30 meq/L (ref 19–32)
Calcium: 9.9 mg/dL (ref 8.4–10.5)
Chloride: 102 meq/L (ref 96–112)
Creatinine, Ser: 1.12 mg/dL (ref 0.40–1.50)
GFR: 72.47 mL/min (ref >60.00)
Glucose, Bld: 100 mg/dL — ABNORMAL HIGH (ref 70–99)
Potassium: 4.8 meq/L (ref 3.5–5.1)
Sodium: 136 meq/L (ref 135–145)
Total Bilirubin: 2 mg/dL — ABNORMAL HIGH (ref 0.2–1.2)
Total Protein: 6.9 g/dL (ref 6.0–8.3)

## 2023-06-16 LAB — PSA: PSA: 1.82 ng/mL (ref 0.10–4.00)

## 2023-06-16 NOTE — Addendum Note (Signed)
Addended by: Gwenevere Abbot on: 06/16/2023 09:51 PM   Modules accepted: Orders

## 2023-06-16 NOTE — Patient Instructions (Addendum)
For you wellness exam today I have ordered cbc, cmp, psa and  lipid panel.  Vaccine today shingrix. 2nd vaccine in 1-6 months.  Referral for colonoscopy placed. If issues getting scheduled let me know.  Recommend exercise and healthy diet.  We will let you know lab results as they come in.  Follow up date appointment will be determined after lab review.    Preventive Care 58-58 Years Old, Male Preventive care refers to lifestyle choices and visits with your health care provider that can promote health and wellness. Preventive care visits are also called wellness exams. What can I expect for my preventive care visit? Counseling During your preventive care visit, your health care provider may ask about your: Medical history, including: Past medical problems. Family medical history. Current health, including: Emotional well-being. Home life and relationship well-being. Sexual activity. Lifestyle, including: Alcohol, nicotine or tobacco, and drug use. Access to firearms. Diet, exercise, and sleep habits. Safety issues such as seatbelt and bike helmet use. Sunscreen use. Work and work Astronomer. Physical exam Your health care provider will check your: Height and weight. These may be used to calculate your BMI (body mass index). BMI is a measurement that tells if you are at a healthy weight. Waist circumference. This measures the distance around your waistline. This measurement also tells if you are at a healthy weight and may help predict your risk of certain diseases, such as type 2 diabetes and high blood pressure. Heart rate and blood pressure. Body temperature. Skin for abnormal spots. What immunizations do I need?  Vaccines are usually given at various ages, according to a schedule. Your health care provider will recommend vaccines for you based on your age, medical history, and lifestyle or other factors, such as travel or where you work. What tests do I  need? Screening Your health care provider may recommend screening tests for certain conditions. This may include: Lipid and cholesterol levels. Diabetes screening. This is done by checking your blood sugar (glucose) after you have not eaten for a while (fasting). Hepatitis B test. Hepatitis C test. HIV (human immunodeficiency virus) test. STI (sexually transmitted infection) testing, if you are at risk. Lung cancer screening. Prostate cancer screening. Colorectal cancer screening. Talk with your health care provider about your test results, treatment options, and if necessary, the need for more tests. Follow these instructions at home: Eating and drinking  Eat a diet that includes fresh fruits and vegetables, whole grains, lean protein, and low-fat dairy products. Take vitamin and mineral supplements as recommended by your health care provider. Do not drink alcohol if your health care provider tells you not to drink. If you drink alcohol: Limit how much you have to 0-2 drinks a day. Know how much alcohol is in your drink. In the [place] one drink equals one 12 oz bottle of beer (355 mL), one 5 oz glass of wine (148 mL), or one 1 oz glass of hard liquor (44 mL). Lifestyle Brush your teeth every morning and night with fluoride toothpaste. Floss one time each day. Exercise for at least 30 minutes 5 or more days each week. Do not use any products that contain nicotine or tobacco. These products include cigarettes, chewing tobacco, and vaping devices, such as e-cigarettes. If you need help quitting, ask your health care provider. Do not use drugs. If you are sexually active, practice safe sex. Use a condom or other form of protection to prevent STIs. Take aspirin only as told by your health care provider.  Make sure that you understand how much to take and what form to take. Work with your health care provider to find out whether it is safe and beneficial for you to take aspirin daily. Find  healthy ways to manage stress, such as: Meditation, yoga, or listening to music. Journaling. Talking to a trusted person. Spending time with friends and family. Minimize exposure to UV radiation to reduce your risk of skin cancer. Safety Always wear your seat belt while driving or riding in a vehicle. Do not drive: If you have been drinking alcohol. Do not ride with someone who has been drinking. When you are tired or distracted. While texting. If you have been using any mind-altering substances or drugs. Wear a helmet and other protective equipment during sports activities. If you have firearms in your house, make sure you follow all gun safety procedures. What's next? Go to your health care provider once a year for an annual wellness visit. Ask your health care provider how often you should have your eyes and teeth checked. Stay up to date on all vaccines. This information is not intended to replace advice given to you by your health care provider. Make sure you discuss any questions you have with your health care provider. Document Revised: 03/25/2021 Document Reviewed: 03/25/2021 Elsevier Patient Education  2024 [employer].

## 2023-06-16 NOTE — Progress Notes (Signed)
Subjective:    Patient ID: Bruce Hart, male    DOB: [birth year], 57 y.o.   MRN: [id number]  HPI  Pt in for wellness exam. He is fasting.   Pt works at [employer]. Is Psychiatric nurse. Exercises 3-4 days a week. Moderate healthy diet. Minimal fried foods. Non smoker. Rare social alcohol use. 1 cup of coffee a day. Occasional soda.   Pt thinks his last tetanus was 5 years ago. But can't remember where done.  Offered shingrix vaccine. Pt agreed to get.  Discussed covid vaccine.   Pt ok with hiv screen.  Review of Systems  Constitutional:  Negative for chills, fatigue and fever.  Respiratory:  Negative for cough, chest tightness, shortness of breath and wheezing.   Cardiovascular:  Negative for chest pain and palpitations.  Gastrointestinal:  Negative for abdominal distention and abdominal pain.  Musculoskeletal:  Negative for back pain.  Skin:  Negative for rash.  Neurological:  Negative for dizziness, light-headedness and headaches.  Hematological:  Negative for adenopathy. Does not bruise/bleed easily.  Psychiatric/Behavioral:  Negative for behavioral problems and decreased concentration. The patient is not nervous/anxious and is not hyperactive.    Past Medical History:  Diagnosis Date   Anemia    as teenager   Arthritis    "all over" (09/12/2017)   Arthritis, multiple joint involvement    Hepatitis 1969   Hep B?    History of kidney stones    PONV (postoperative nausea and vomiting)      Social History   Socioeconomic History   Marital status: Married    Spouse name: Not on file   Number of children: Not on file   Years of education: Not on file   Highest education level: Not on file  Occupational History   Not on file  Tobacco Use   Smoking status: Never   Smokeless tobacco: Never  Vaping Use   Vaping status: Never Used  Substance and Sexual Activity   Alcohol use: Yes    Comment: 09/12/2017 "couple drinks/year"   Drug use: No   Sexual activity: Yes  Other  Topics Concern   Not on file  Social History Narrative   Not on file   Social Determinants of Health   Financial Resource Strain: Not on file  Food Insecurity: Not on file  Transportation Needs: Not on file  Physical Activity: Not on file  Stress: Not on file  Social Connections: Not on file  Intimate Partner Violence: Not on file    Past Surgical History:  Procedure Laterality Date   JOINT REPLACEMENT     SHOULDER ARTHROSCOPY W/ ROTATOR CUFF REPAIR Left 1992   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   Procedure: RIGHT TOTAL HIP ARTHROPLASTY ANTERIOR APPROACH;  Surgeon: Samson Frederic, MD;  Location: [facility] OR;  Service: Orthopedics;  Laterality: Right;  Needs RNFA    Family History  Problem Relation Age of Onset   Hyperlipidemia Mother    Hypertension Mother    Sudden death Father    Diabetes Father    Hyperlipidemia Father    Hypertension Father     Allergies  Allergen Reactions   Tramadol Other (See Comments)    SWEATING DISORIENTATION DELUSIONS    Current Outpatient Medications on File Prior to Visit  Medication Sig Dispense Refill   tacrolimus (PROTOPIC) 0.1 % ointment Apply 1(one) application(s) topically 1-2 times a day to rash under eyes. 30 g 1   tamsulosin (FLOMAX) 0.4  MG CAPS capsule Take 1 capsule once daily 90 capsule 3   No current facility-administered medications on file prior to visit.    BP 125/70 (BP Location: Left Arm, Patient Position: Sitting, Cuff Size: Small)   Pulse (!) 57   Temp 98.6 F (37 C) (Oral)   Resp 16   Ht 5\' 10"  (1.778 m)   Wt 189 lb (85.7 kg)   SpO2 100%   BMI 27.12 kg/m         Objective:   Physical Exam   General Mental Status- Alert. General Appearance- Not in acute distress.    Skin General: Color- Normal Color. Moisture- Normal Moisture.   Neck Carotid Arteries- Normal color. Moisture- Normal Moisture. No carotid bruits. No JVD.   Chest and Lung  Exam Auscultation: Breath Sounds:-Normal.   Cardiovascular Auscultation:Rythm- Regular. Murmurs & Other Heart Sounds:Auscultation of the heart reveals- No Murmurs.   Abdomen Inspection:-Inspeection Normal. Palpation/Percussion:Note:No mass. Palpation and Percussion of the abdomen reveal- Non Tender, Non Distended + BS, no rebound or guarding.     Neurologic Cranial Nerve exam:- CN III-XII intact(No nystagmus), symmetric smile. Strength:- 5/5 equal and symmetric strength both upper and lower extremities.    Back- no cva tenderness.     Assessment & Plan:  For you wellness exam today I have ordered cbc, cmp, psa and  lipid panel.  Vaccine today shingrix. 2nd vaccine in 1-6 months.  Referral for colonoscopy placed. If issues getting scheduled let me know.  Recommend exercise and healthy diet.  We will let you know lab results as they come in.  Follow up date appointment will be determined after lab review.      Esperanza Richters, PA-C

## 2023-06-16 NOTE — Addendum Note (Signed)
Addended by: Wilford Corner on: 06/16/2023 09:08 AM   Modules accepted: Orders

## 2023-06-21 ENCOUNTER — Ambulatory Visit (INDEPENDENT_AMBULATORY_CARE_PROVIDER_SITE_OTHER): Payer: 59

## 2023-06-21 ENCOUNTER — Other Ambulatory Visit: Payer: Self-pay | Admitting: *Deleted

## 2023-06-21 DIAGNOSIS — R7309 Other abnormal glucose: Secondary | ICD-10-CM

## 2023-06-21 LAB — HEMOGLOBIN A1C: Hgb A1c MFr Bld: 6 % (ref 4.6–6.5)

## 2023-08-01 ENCOUNTER — Encounter: Payer: Self-pay | Admitting: Medical

## 2023-08-01 MED ORDER — ATORVASTATIN CALCIUM 10 MG PO TABS
10.0000 mg | ORAL_TABLET | Freq: Every day | ORAL | 3 refills | Status: DC
Start: 2023-08-01 — End: 2024-08-10
  Filled 2023-08-01: qty 30, 30d supply, fill #0
  Filled 2023-09-12: qty 30, 30d supply, fill #1
  Filled 2023-10-10: qty 30, 30d supply, fill #2
  Filled 2023-11-16: qty 30, 30d supply, fill #3
  Filled 2024-01-02: qty 30, 30d supply, fill #4
  Filled 2024-02-21: qty 30, 30d supply, fill #5
  Filled 2024-04-03: qty 30, 30d supply, fill #6
  Filled 2024-05-02: qty 30, 30d supply, fill #7
  Filled 2024-06-13: qty 30, 30d supply, fill #8
  Filled 2024-07-13: qty 30, 30d supply, fill #9

## 2023-08-01 NOTE — Addendum Note (Signed)
Addended by: Gwenevere Abbot on: 08/01/2023 08:26 PM   Modules accepted: Orders

## 2023-08-02 ENCOUNTER — Other Ambulatory Visit (HOSPITAL_BASED_OUTPATIENT_CLINIC_OR_DEPARTMENT_OTHER): Payer: Self-pay

## 2023-08-02 NOTE — Addendum Note (Signed)
Addended by: Gwenevere Abbot on: 08/02/2023 11:27 AM   Modules accepted: Orders

## 2023-08-11 ENCOUNTER — Ambulatory Visit: Payer: 59

## 2023-08-11 VITALS — BP 108/66 | HR 60 | Ht 70.0 in | Wt 195.0 lb

## 2023-08-11 DIAGNOSIS — E782 Mixed hyperlipidemia: Secondary | ICD-10-CM

## 2023-08-11 NOTE — Progress Notes (Signed)
Cardiology Consultation:    Date:  08/11/2023   ID:  Bruce Hart, DOB 27-Mar-1966, MRN [id number]  PCP:  Esperanza Richters, PA-C  Cardiologist:  Marlyn Corporal Yehoshua Vitelli, MD   Referring MD: Marisue Brooklyn   No chief complaint on file. Dyslipidemia   ASSESSMENT AND PLAN:   57 year old male patient with no significant prior cardiac history, noted to have prediabetes [hemoglobin A1c 6], BPH, dyslipidemia, family history for premature heart disease here for further evaluation and discussion with regards to his dyslipidemia management and cardiovascular risk.  Problem List Items Addressed This Visit     Hyperlipidemia - Primary    Lipid panel from 06-21-2023 with total cholesterol 232, HDL 74, LDL 137, triglycerides 106.  The 10-year ASCVD risk score (Arnett DK, et al., 2019) is: 4.6%   Values used to calculate the score:     Age: 13 years     Sex: Male     Is Non-Hispanic African American: No     Diabetic: No     Tobacco smoker: No     Systolic Blood Pressure: 108 mmHg     Is BP treated: No     HDL Cholesterol: 74.3 mg/dL     Total Cholesterol: 232 mg/dL  Given his overall risk factors at this time, statin therapy can be considered for primary prevention.  Further restratification options were reviewed at length. Coronary calcium score for this purpose can help further stratify and help determine whether he would be a good candidate to stay off statins versus titrate up the dose of statins and if further needed and antiplatelet therapy to be added.  He currently favors holding off on further restratification investigation methods and continue with his current lipid-lowering therapy as initiated with atorvastatin 10 mg once daily. He is tolerating the medication well although its only been a week.  Other than minor aches about 4 to 5 days ago he does not seem to have any significant concerns at this time.   Educated about healthy dietary and lifestyle modifications  specifically to focus on DASH diet, seafoods, avoiding red meats.  He can continue to follow-up with his primary care provider or see [place] on an as-needed basis.      Relevant Orders   EKG 12-Lead (Completed)   Return to clinic on an as-needed basis   History of Present Illness:    Bruce Hart is a 57 y.o. male who is being seen today for the evaluation of dyslipidemia at the request of Saguier, Ramon Dredge, [place].   Pleasant gentleman here for the visit accompanied by his wife. No significant prior cardiac history. Has history of BPH for which she is on tamsulosin, recently with dyslipidemia was started on atorvastatin 10 mg once daily about a week ago.  Hemoglobin A1c recently suggested prediabetes.  Physically he does not have any significant limitations.  Active with outdoor activities and plays basketball routinely without any symptoms.  No significant cardiac symptoms.  EKG today the clinic normal sinus rhythm heart rate 60/min, normal QRS axis and duration 88 ms.  Reports family history of premature coronary artery disease in his brother in his 28s suffering an MI.  Labs from 06-21-2023 hemoglobin A1c borderline 6, Lipid panel total cholesterol 232, HDL 74, LDL 137, triglycerides 106 BUN 18, creatinine 1.12, EGFR 72 Normal transaminases and alkaline phosphatase, mildly elevated total bilirubin 2 Sodium 136, potassium 4.8 Hemoglobin 14.7, hematocrit 44.9, WBC 3.3, platelets 178  The 10-year ASCVD risk score (Arnett DK, et  al., 2019) is: 4.6%   Values used to calculate the score:     Age: 39 years     Sex: Male     Is Non-Hispanic African American: No     Diabetic: No     Tobacco smoker: No     Systolic Blood Pressure: 108 mmHg     Is BP treated: No     HDL Cholesterol: 74.3 mg/dL     Total Cholesterol: 232 mg/dL   Past Medical History:  Diagnosis Date   Anemia    as teenager   Arthritis    "all over" (09/12/2017)   Arthritis, multiple joint involvement    Hepatitis  1969   Hep B?    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   tacrolimus (PROTOPIC) 0.1 % ointment Apply 1(one) application(s) topically 1-2 times a day to rash under eyes.   tamsulosin (FLOMAX) 0.4 MG CAPS capsule Take 1 capsule once daily     Allergies:   Tramadol   Social History   Socioeconomic History   Marital status: Married    Spouse name: Not on file   Number of children: Not on file   Years of education: Not on file   Highest education level: Not on file  Occupational History   Not on file  Tobacco Use   Smoking status: Never   Smokeless tobacco: Never  Vaping Use   Vaping status: Never Used  Substance and Sexual Activity   Alcohol use: Yes    Comment: 09/12/2017 "couple drinks/year"   Drug use: No   Sexual activity: Yes  Other Topics Concern   Not on file  Social History Narrative   Not on file   Social Determinants of Health   Financial Resource Strain: Not on file  Food Insecurity: Not on file  Transportation Needs: Not on file  Physical Activity: Not on file  Stress: Not on file  Social Connections: Not on file     Family History: The patient's family history includes Diabetes in his father; Hyperlipidemia in his father and mother; Hypertension in his father and mother; Sudden death in his father. ROS:   Please see the history of present illness.    All 14 point review of systems negative except as described  per history of present illness.  EKGs/Labs/Other Studies Reviewed:    The following studies were reviewed today:   EKG:  EKG Interpretation Date/Time:  Thursday August 11 2023 08:57:08 EDT Ventricular Rate:  60 PR Interval:  184 QRS Duration:  88 QT Interval:  420 QTC Calculation: 420 R Axis:   74  Text Interpretation: Normal sinus rhythm Normal ECG When compared with ECG of 14-Jul-2005 19:51, Vent. rate has decreased BY  38 BPM Confirmed by Huntley Dec reddy [phone]) on 08/11/2023 9:15:13 AM    Recent Labs: 06/16/2023: ALT 25; BUN 18; Creatinine, Ser 1.12; Hemoglobin 14.7; Platelets 178.0; Potassium 4.8; Sodium 136  Recent Lipid Panel    Component Value Date/Time   CHOL 232 (H) 06/16/2023 0908   TRIG 106.0 06/16/2023 0908   HDL 74.30 06/16/2023 0908   CHOLHDL 3 06/16/2023 0908   VLDL 21.2 06/16/2023 0908   LDLCALC 137 (H) 06/16/2023 0908    Physical Exam:    VS:  BP 108/66   Pulse 60   Ht 5\' 10"  (1.778 m)   Wt 195 lb (88.5 kg)   SpO2 99%   BMI 27.98 kg/m     Wt Readings from Last 3 Encounters:  08/11/23 195 lb (88.5 kg)  06/16/23 189 lb (85.7 kg)  02/18/23 175 lb (79.4 kg)     GENERAL:  Well nourished, well developed in no acute distress NECK: No JVD; No carotid bruits CARDIAC: RRR, S1 and S2 present, no murmurs, no rubs, no gallops CHEST:  Clear to auscultation without rales, wheezing or rhonchi  Extremities: No pitting pedal edema. Pulses bilaterally symmetric with radial 2+ and dorsalis pedis 2+ NEUROLOGIC:  Alert and oriented x 3  Medication Adjustments/Labs and Tests Ordered: Current medicines are reviewed at length with the patient today.  Concerns regarding medicines are outlined above.  Orders Placed This Encounter  Procedures   EKG 12-Lead   No orders of the defined types were placed in this encounter.   Signed, Yeison Sippel reddy Jerita Wimbush, MD, MPH, [facility]. 08/11/2023 9:45 AM    [facility]

## 2023-08-11 NOTE — Patient Instructions (Signed)
Medication Instructions:  Your physician recommends that you continue on your current medications as directed. Please refer to the Current Medication list given to you today.  *If you need a refill on your cardiac medications before your next appointment, please call your pharmacy*   Lab Work: None ordered   If you have labs (blood work) drawn today and your tests are completely normal, you will receive your results only by: MyChart Message (if you have MyChart) OR A paper copy in the mail If you have any lab test that is abnormal or we need to change your treatment, we will call you to review the results.   Testing/Procedures: None ordered    Follow-Up: Follow up as needed   Other Instructions

## 2023-08-11 NOTE — Assessment & Plan Note (Signed)
Lipid panel from 06-21-2023 with total cholesterol 232, HDL 74, LDL 137, triglycerides 106.  The 10-year ASCVD risk score (Arnett DK, et al., 2019) is: 4.6%   Values used to calculate the score:     Age: 57 years     Sex: Male     Is Non-Hispanic African American: No     Diabetic: No     Tobacco smoker: No     Systolic Blood Pressure: 108 mmHg     Is BP treated: No     HDL Cholesterol: 74.3 mg/dL     Total Cholesterol: 232 mg/dL  Given his overall risk factors at this time, statin therapy can be considered for primary prevention.  Further restratification options were reviewed at length. Coronary calcium score for this purpose can help further stratify and help determine whether he would be a good candidate to stay off statins versus titrate up the dose of statins and if further needed and antiplatelet therapy to be added.  He currently favors holding off on further restratification investigation methods and continue with his current lipid-lowering therapy as initiated with atorvastatin 10 mg once daily. He is tolerating the medication well although its only been a week.  Other than minor aches about 4 to 5 days ago he does not seem to have any significant concerns at this time.   Educated about healthy dietary and lifestyle modifications specifically to focus on DASH diet, seafoods, avoiding red meats.  He can continue to follow-up with his primary care provider or see [place] on an as-needed basis.

## 2023-08-15 ENCOUNTER — Other Ambulatory Visit (HOSPITAL_COMMUNITY): Payer: Self-pay

## 2023-08-20 LAB — HM COLONOSCOPY

## 2023-08-26 ENCOUNTER — Ambulatory Visit (INDEPENDENT_AMBULATORY_CARE_PROVIDER_SITE_OTHER): Payer: 59

## 2023-08-26 DIAGNOSIS — Z23 Encounter for immunization: Secondary | ICD-10-CM | POA: Diagnosis not present

## 2023-08-26 NOTE — Progress Notes (Signed)
Bruce Hart is a 57 y.o. male presents to the office today for Shingrix #2 injection, per physician's orders. Original order: 06/16/2023 Shingrix 0.5 mL IM was administered L Deltoid  today. Patient tolerated injection. Patient due for follow up labs/provider appt: No.  Patient next injection due: n/a- series completed  Creft, Melton Alar L

## 2023-08-31 ENCOUNTER — Ambulatory Visit: Payer: 59 | Admitting: Sports Medicine

## 2023-08-31 ENCOUNTER — Ambulatory Visit (INDEPENDENT_AMBULATORY_CARE_PROVIDER_SITE_OTHER): Payer: 59

## 2023-08-31 VITALS — BP 130/84 | HR 66 | Ht 70.0 in | Wt 198.0 lb

## 2023-08-31 DIAGNOSIS — M25561 Pain in right knee: Secondary | ICD-10-CM

## 2023-08-31 DIAGNOSIS — M25511 Pain in right shoulder: Secondary | ICD-10-CM | POA: Diagnosis not present

## 2023-08-31 DIAGNOSIS — M7551 Bursitis of right shoulder: Secondary | ICD-10-CM

## 2023-08-31 DIAGNOSIS — M1711 Unilateral primary osteoarthritis, right knee: Secondary | ICD-10-CM

## 2023-08-31 DIAGNOSIS — M19011 Primary osteoarthritis, right shoulder: Secondary | ICD-10-CM | POA: Diagnosis not present

## 2023-08-31 NOTE — Patient Instructions (Signed)
Knee shoulder HEP 4 week follow up

## 2023-08-31 NOTE — Progress Notes (Signed)
Bruce Hart D.[facility] [address] [place] [address] Phone: [phone]   Assessment and Plan:     1. Acute pain of right knee 2. Primary osteoarthritis of right knee -Chronic with exacerbation, initial sports medicine visit - Most consistent with flare of osteoarthritis based on HPI, physical exam, x-ray imaging - X-ray obtained in clinic.  My interpretation: No acute fracture or dislocation.  Severe degenerative changes primarily in medial and patellofemoral compartment with moderate degenerative changes in lateral compartment - Patient elected for intra-articular CSI.  Tolerated well per note below  Procedure: Knee Joint Injection Side: Right Indication: Flare of osteoarthritis  Risks explained and consent was given verbally. The site was cleaned with alcohol prep. A needle was introduced with an anterio-lateral approach. Injection given using 2mL of 1% lidocaine without epinephrine and 1mL of kenalog 40mg /ml. This was well tolerated and resulted in symptomatic relief.  Needle was removed, hemostasis achieved, and post injection instructions were explained.   Pt was advised to call or return to clinic if these symptoms worsen or fail to improve as anticipated.   3. Acute pain of right shoulder 4. Primary osteoarthritis of right shoulder 5. Subacromial bursitis of right shoulder joint  -Chronic with exacerbation, initial sports medicine visit - Most consistent with flare of underlying osteoarthritis leading to irritation and inflammation of rotator cuff.  Some concern for partial tearing of supraspinatus based on acute injury playing basketball, weakness with abduction - X-ray obtained in clinic.  My interpretation: No acute fracture or dislocation.  Inferior glenoid spurring, decreased joint space, distal acromion cortical irregularities - Patient has had CSI and NSAID courses in the past, and he feels that he typically responds better  to CSI.  Patient elected for subacromial CSI.  Tolerated well per note below - Start HEP  Procedure: Subacromial Injection Side: Right  Risks explained and consent was given verbally. The site was cleaned with alcohol prep. A steroid injection was performed from posterior approach using 2mL of 1% lidocaine without epinephrine and 1mL of kenalog 40mg /ml. This was well tolerated and resulted in symptomatic relief.  Needle was removed, hemostasis achieved, and post injection instructions were explained.   Pt was advised to call or return to clinic if these symptoms worsen or fail to improve as anticipated.   15 additional minutes spent for educating Therapeutic Home Exercise Program.  This included exercises focusing on stretching, strengthening, with focus on eccentric aspects.   Long term goals include an improvement in range of motion, strength, endurance as well as avoiding reinjury. Patient's frequency would include in 1-2 times a day, 3-5 times a week for a duration of 6-12 weeks. Proper technique shown and discussed handout in great detail with ATC.  All questions were discussed and answered.    Pertinent previous records reviewed include none  Follow Up: 4 weeks for reevaluation.  If no improvement or worsening of symptoms, could consider ultrasound versus physical therapy versus advanced imaging versus NSAID course   Subjective:   I, Bruce Hart, am serving as a Neurosurgeon for Doctor Richardean Sale  Chief Complaint: right shoulder and right knee pain   HPI:   08/31/2023 Patient is a 57 year old male with concerns of right shoulder and right knee pain. Patient states right knee last Thursday after playing basketball. Pain with flexion. Ibu for the pain . Pain has Improved but not gone. Patellar tendon pain. Pain with lateral movement as well.   Shoulder happened last  night playing basketball . Decreased ROM. Anterior and lateral shoulder pain. He was not able to finish his game. No  numbness and tingling. No radiating pain. Ibu does not help   Relevant Historical Information: None pertinent  Additional pertinent review of systems negative.   Current Outpatient Medications:    atorvastatin (LIPITOR) 10 MG tablet, Take 1 tablet (10 mg total) by mouth daily., Disp: 90 tablet, Rfl: 3   tacrolimus (PROTOPIC) 0.1 % ointment, Apply 1(one) application(s) topically 1-2 times a day to rash under eyes., Disp: 30 g, Rfl: 1   tamsulosin (FLOMAX) 0.4 MG CAPS capsule, Take 1 capsule once daily, Disp: 90 capsule, Rfl: 3   Objective:     Vitals:   08/31/23 1536  BP: 130/84  Pulse: 66  SpO2: 100%  Weight: 198 lb (89.8 kg)  Height: 5\' 10"  (1.778 m)      Body mass index is 28.41 kg/m.    Physical Exam:    General:  awake, alert oriented, no acute distress nontoxic Skin: no suspicious lesions or rashes Neuro:sensation intact and strength 5/5 with no deficits, no atrophy, normal muscle tone Psych: No signs of anxiety, depression or other mood disorder  Right knee: No swelling No deformity Neg fluid wave, joint milking ROM Flex 95, Ext 10 TTP medial joint line, medial femoral condyle NTTP over the quad tendon,  , lat fem condyle, patella, plica, patella tendon, tibial tuberostiy, fibular head, posterior fossa, pes anserine bursa, gerdy's tubercle,   lateral jt line Neg anterior and posterior drawer Neg lachman Neg sag sign Negative varus stress Negative valgus stress Negative McMurray   Gait normal     Right shoulder:  No deformity, swelling or muscle wasting No scapular winging FF  80, abd 80, int 30, ext 60 TTP biceps groove NTTP over the [place], clavicle, ac, coracoid, humerus, deltoid, trapezius, cervical spine Special testing limited due to limited ROM  Negative Spurling's test bilat FROM of neck   Electronically signed by:  Bruce Hart [facility] 4:26 PM 08/31/23

## 2023-09-02 ENCOUNTER — Other Ambulatory Visit (HOSPITAL_BASED_OUTPATIENT_CLINIC_OR_DEPARTMENT_OTHER): Payer: Self-pay

## 2023-09-02 ENCOUNTER — Ambulatory Visit (INDEPENDENT_AMBULATORY_CARE_PROVIDER_SITE_OTHER): Payer: 59 | Admitting: Medical

## 2023-09-02 VITALS — BP 118/60 | HR 60 | Temp 98.2°F | Resp 18 | Ht 70.0 in | Wt 196.4 lb

## 2023-09-02 DIAGNOSIS — R300 Dysuria: Secondary | ICD-10-CM | POA: Diagnosis not present

## 2023-09-02 DIAGNOSIS — R350 Frequency of micturition: Secondary | ICD-10-CM | POA: Diagnosis not present

## 2023-09-02 LAB — CBC WITH DIFFERENTIAL/PLATELET
Basophils Absolute: 0 10*3/uL (ref 0.0–0.1)
Basophils Relative: 0.3 % (ref 0.0–3.0)
Eosinophils Absolute: 0 10*3/uL (ref 0.0–0.7)
Eosinophils Relative: 0.5 % (ref 0.0–5.0)
HCT: 43.8 % (ref 39.0–52.0)
Hemoglobin: 14.3 g/dL (ref 13.0–17.0)
Lymphocytes Relative: 15.7 % (ref 12.0–46.0)
Lymphs Abs: 0.8 10*3/uL (ref 0.7–4.0)
MCHC: 32.6 g/dL (ref 30.0–36.0)
MCV: 90.5 fL (ref 78.0–100.0)
Monocytes Absolute: 0.4 10*3/uL (ref 0.1–1.0)
Monocytes Relative: 7 % (ref 3.0–12.0)
Neutro Abs: 3.8 10*3/uL (ref 1.4–7.7)
Neutrophils Relative %: 76.5 % (ref 43.0–77.0)
Platelets: 236 10*3/uL (ref 150.0–400.0)
RBC: 4.84 Mil/uL (ref 4.22–5.81)
RDW: 13.9 % (ref 11.5–15.5)
WBC: 5 10*3/uL (ref 4.0–10.5)

## 2023-09-02 LAB — POCT URINALYSIS DIPSTICK
Bilirubin, UA: NEGATIVE
Blood, UA: NEGATIVE
Glucose, UA: NEGATIVE
Ketones, UA: NEGATIVE
Nitrite, UA: NEGATIVE
Protein, UA: NEGATIVE
Spec Grav, UA: 1.02 (ref 1.010–1.025)
Urobilinogen, UA: 0.2 U/dL
pH, UA: 5 (ref 5.0–8.0)

## 2023-09-02 LAB — PSA: PSA: 3.12 ng/mL (ref 0.10–4.00)

## 2023-09-02 MED ORDER — CIPROFLOXACIN HCL 500 MG PO TABS
500.0000 mg | ORAL_TABLET | Freq: Two times a day (BID) | ORAL | 0 refills | Status: AC
  Filled 2023-09-02: qty 20, 10d supply, fill #0

## 2023-09-02 NOTE — Progress Notes (Signed)
Subjective:    Patient ID: Bruce Hart, male    DOB: Sep 04, 1966, 57 y.o.   MRN: [id number]  HPI  Discussed the use of AI scribe software for clinical note transcription with the patient, who gave verbal consent to proceed.  History of Present Illness   The patient, with a history of benign prostatic hypertrophy (BPH) and incomplete bladder emptying, presents with urinary symptoms for the past couple of days. He reports frequent urination, particularly at night, with up to seven episodes of nocturia. He describes a strong burning sensation during urination, but denies any associated pain. He also reports episodes of night sweats, but is unsure if these are related to the urinary symptoms.  A few weeks prior, he experienced a similar episode of frequent urination, which he attributed to preparation for a colonoscopy. The symptoms resolved post-procedure. He has a past medical history of urinary tract infections (UTIs), with the last episode approximately two years ago. At that time, he was treated with Ciprofloxacin, and his symptoms resolved.  The patient is currently on Flomax for BPH. He was previously on Myrbetriq, but it was discontinued due to side effects. He has a history of a significant spike in his PSA levels two years ago, which subsequently normalized.       Review of Systems  Constitutional:  Negative for chills, fatigue and fever.  Respiratory:  Negative for cough, chest tightness, shortness of breath and wheezing.   Cardiovascular:  Negative for chest pain and palpitations.  Gastrointestinal:  Negative for abdominal pain.  Genitourinary:  Positive for dysuria and frequency. Negative for flank pain, penile pain, testicular pain and urgency.  Musculoskeletal:  Negative for back pain and neck pain.  Skin:  Negative for rash.  Neurological:  Negative for dizziness and light-headedness.  Hematological:  Negative for adenopathy. Does not bruise/bleed easily.   Psychiatric/Behavioral:  Negative for behavioral problems and decreased concentration.     Past Medical History:  Diagnosis Date   Anemia    as teenager   Arthritis    "all over" (09/12/2017)   Arthritis, multiple joint involvement    Hepatitis 1969   Hep B?    History of kidney stones    PONV (postoperative nausea and vomiting)      Social History   Socioeconomic History   Marital status: Married    Spouse name: Not on file   Number of children: Not on file   Years of education: Not on file   Highest education level: Not on file  Occupational History   Not on file  Tobacco Use   Smoking status: Never   Smokeless tobacco: Never  Vaping Use   Vaping status: Never Used  Substance and Sexual Activity   Alcohol use: Yes    Comment: 09/12/2017 "couple drinks/year"   Drug use: No   Sexual activity: Yes  Other Topics Concern   Not on file  Social History Narrative   Not on file   Social Determinants of Health   Financial Resource Strain: Not on file  Food Insecurity: Not on file  Transportation Needs: Not on file  Physical Activity: Not on file  Stress: Not on file  Social Connections: Not on file  Intimate Partner Violence: Not on file    Past Surgical History:  Procedure Laterality Date   JOINT REPLACEMENT     SHOULDER ARTHROSCOPY W/ ROTATOR CUFF REPAIR Left 1992   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  Procedure: RIGHT TOTAL HIP ARTHROPLASTY ANTERIOR APPROACH;  Surgeon: Samson Frederic, MD;  Location: [facility] OR;  Service: Orthopedics;  Laterality: Right;  Needs RNFA    Family History  Problem Relation Age of Onset   Hyperlipidemia Mother    Hypertension Mother    Sudden death Father    Diabetes Father    Hyperlipidemia Father    Hypertension Father     Allergies  Allergen Reactions   Tramadol Other (See Comments)    SWEATING DISORIENTATION DELUSIONS    Current Outpatient Medications on File Prior to  Visit  Medication Sig Dispense Refill   atorvastatin (LIPITOR) 10 MG tablet Take 1 tablet (10 mg total) by mouth daily. 90 tablet 3   tacrolimus (PROTOPIC) 0.1 % ointment Apply 1(one) application(s) topically 1-2 times a day to rash under eyes. 30 g 1   tamsulosin (FLOMAX) 0.4 MG CAPS capsule Take 1 capsule once daily 90 capsule 3   No current facility-administered medications on file prior to visit.    BP 118/60   Pulse 60   Temp 98.2 F (36.8 C)   Resp 18   Ht 5\' 10"  (1.778 m)   Wt 196 lb 6.4 oz (89.1 kg)   SpO2 98%   BMI 28.18 kg/m        Objective:   Physical Exam  General- No acute distress. Pleasant patient. Neck- Full range of motion, no jvd Lungs- Clear, even and unlabored. Heart- regular rate and rhythm. Neurologic- CNII- XII grossly intact.  Abdomen- soft, nt, nd, +bs, no rebound or guarding. Back- no cva tenderness      Assessment & Plan:   Assessment and Plan    Frequent urination(possible prostatitis) Frequent urination and dysuria for a few days. History of UTIs and BPH with incomplete bladder emptying. No fever, chills, or back pain. Mild nocturnal sweating. -Order urine culture and sensitivity. -Order CBC to assess for infection. -Start Ciprofloxacin 500mg  BID for 10 days pending culture results.  Benign Prostatic Hyperplasia (BPH) History of BPH with incomplete bladder emptying. Currently on Flomax. Previous elevation in PSA with resolution. -Order PSA level. -Consider referral to urology based on test results.  Follow-up date pending test results. Also upcoming urology routine follow up.  Esperanza Richters, PA-C

## 2023-09-02 NOTE — Patient Instructions (Addendum)
Frequent urination(possible prostatitis) Frequent urination and dysuria for a few days. History of UTIs and BPH with incomplete bladder emptying. No fever, chills, or back pain. Mild nocturnal sweating. -Order urine culture and sensitivity. -Order CBC to assess for infection. -Start Ciprofloxacin 500mg  BID for 10 days pending culture results.  Benign Prostatic Hyperplasia (BPH) History of BPH with incomplete bladder emptying. Currently on Flomax. Previous elevation in PSA with resolution. -Order PSA level. -Consider referral to urology based on test results.  Follow-up date pending test results. Also upcoming urology routine follow up.

## 2023-09-04 LAB — URINE CULTURE
MICRO NUMBER:: 15768536
SPECIMEN QUALITY:: ADEQUATE

## 2023-09-12 ENCOUNTER — Encounter: Payer: Self-pay | Admitting: Medical

## 2023-09-13 ENCOUNTER — Other Ambulatory Visit (HOSPITAL_COMMUNITY): Payer: Self-pay

## 2023-09-27 NOTE — Progress Notes (Unsigned)
    Aleen Sells D.[facility] [address] [place] [address] Phone: [phone]   Assessment and Plan:     There are no diagnoses linked to this encounter.  ***   Pertinent previous records reviewed include ***    Follow Up: ***     Subjective:   I, Brittnei Jagiello, am serving as a Neurosurgeon for Doctor Richardean Sale   Chief Complaint: right shoulder and right knee pain    HPI:    08/31/2023 Patient is a 57 year old male with concerns of right shoulder and right knee pain. Patient states right knee last Thursday after playing basketball. Pain with flexion. Ibu for the pain . Pain has Improved but not gone. Patellar tendon pain. Pain with lateral movement as well.    Shoulder happened last night playing basketball . Decreased ROM. Anterior and lateral shoulder pain. He was not able to finish his game. No numbness and tingling. No radiating pain. Ibu does not help   09/28/2023 Patient states   Relevant Historical Information: None pertinent    Additional pertinent review of systems negative.   Current Outpatient Medications:    atorvastatin (LIPITOR) 10 MG tablet, Take 1 tablet (10 mg total) by mouth daily., Disp: 90 tablet, Rfl: 3   ciprofloxacin (CIPRO) 500 MG tablet, Take 1 tablet (500 mg total) by mouth 2 (two) times daily., Disp: 20 tablet, Rfl: 0   tacrolimus (PROTOPIC) 0.1 % ointment, Apply 1(one) application(s) topically 1-2 times a day to rash under eyes., Disp: 30 g, Rfl: 1   tamsulosin (FLOMAX) 0.4 MG CAPS capsule, Take 1 capsule once daily, Disp: 90 capsule, Rfl: 3   Objective:     There were no vitals filed for this visit.    There is no height or weight on file to calculate BMI.    Physical Exam:    ***   Electronically signed by:  Aleen Sells [facility] 7:35 AM 09/27/23

## 2023-09-28 ENCOUNTER — Ambulatory Visit: Payer: 59 | Admitting: Sports Medicine

## 2023-09-28 ENCOUNTER — Other Ambulatory Visit: Payer: Self-pay

## 2023-09-28 VITALS — BP 118/82 | HR 76 | Ht 70.0 in | Wt 190.0 lb

## 2023-09-28 DIAGNOSIS — M25511 Pain in right shoulder: Secondary | ICD-10-CM

## 2023-09-28 DIAGNOSIS — M1711 Unilateral primary osteoarthritis, right knee: Secondary | ICD-10-CM | POA: Diagnosis not present

## 2023-09-28 DIAGNOSIS — M19011 Primary osteoarthritis, right shoulder: Secondary | ICD-10-CM | POA: Diagnosis not present

## 2023-09-28 DIAGNOSIS — M25561 Pain in right knee: Secondary | ICD-10-CM

## 2023-09-28 NOTE — Patient Instructions (Signed)
Call in 2-3 weeks if no better and ask for meloxicam  And if no better after injection and meloxicam call to get on schedule

## 2023-10-11 ENCOUNTER — Other Ambulatory Visit (HOSPITAL_COMMUNITY): Payer: Self-pay

## 2023-11-11 ENCOUNTER — Ambulatory Visit
Admission: RE | Admit: 2023-11-11 | Discharge: 2023-11-11 | Disposition: A | Discharge status: Home / Self Care | Payer: 59 | Source: Ambulatory Visit | Attending: Internal Medicine | Admitting: Internal Medicine

## 2023-11-11 VITALS — BP 139/73 | HR 73 | Temp 98.0°F | Resp 16

## 2023-11-11 DIAGNOSIS — R519 Headache, unspecified: Secondary | ICD-10-CM | POA: Diagnosis not present

## 2023-11-11 MED ORDER — KETOROLAC TROMETHAMINE 30 MG/ML IJ SOLN
30.0000 mg | Freq: Once | INTRAMUSCULAR | Status: AC
  Administered 2023-11-11: 30 mg via INTRAMUSCULAR

## 2023-11-11 MED ORDER — METOCLOPRAMIDE HCL 5 MG/ML IJ SOLN: 5.0000 mg | Freq: Once | INTRAVENOUS | Status: DC

## 2023-11-11 MED ORDER — DEXAMETHASONE SODIUM PHOSPHATE 10 MG/ML IJ SOLN
10.0000 mg | Freq: Once | INTRAMUSCULAR | Status: AC
  Administered 2023-11-11: 10 mg via INTRAMUSCULAR

## 2023-11-11 MED ORDER — METOCLOPRAMIDE HCL 5 MG/ML IJ SOLN
5.0000 mg | Freq: Once | INTRAMUSCULAR | Status: AC
  Administered 2023-11-11: 5 mg via INTRAMUSCULAR

## 2023-11-11 NOTE — ED Triage Notes (Addendum)
Pt c/o headache since Wednesday. States he does not get migraines and headache is mostly in back.  Took aspirin yesterday that helped a little.

## 2023-11-11 NOTE — Discharge Instructions (Addendum)

## 2023-11-11 NOTE — ED Provider Notes (Addendum)
[facility]    CSN: [id number] Arrival date & time: 11/11/23  4098      History   Chief Complaint Chief Complaint  Patient presents with   Headache    Headache started Wednesday afternoon and has not subsided, despite taking pain medication. - Entered by patient    HPI Bruce Hart is a 58 y.o. male.   Bruce Hart is a 58 y.o. male presenting for chief complaint of headache that started 2 days ago without trauma/injury to the head. Headache started to both sides of the head (temporal/parietal area) and has now moved to the occiput. Describes headache as a constant, dull, ache that is currently a 4/10. Pain level last night was 7/10. He has had a similar headache many years ago but states this one is lasting longer than normal. Denies extremity weakness, paresthesias, recent falls, fevers, chills, dizziness, N/V/D, abdominal pain, rash, viral URI symptoms, speech changes, cognition changes, and vision changes. No photophobia or phonophobia. Denies history of migraine headaches and states headaches happen infrequently. Taking aspirin for headache with minimal relief. Last dose of aspirin was last night.    Headache   Past Medical History:  Diagnosis Date   Anemia    as teenager   Arthritis    "all over" (09/12/2017)   Arthritis, multiple joint involvement    Hepatitis 1969   Hep B?    History of kidney stones    PONV (postoperative nausea and vomiting)     Patient Active Problem List   Diagnosis Date Noted   Biceps rupture, proximal, right, initial encounter 02/23/2022   Knee effusion, right 12/04/2021   Cervical radiculopathy 11/16/2021   Capsulitis of right shoulder 05/16/2020   Mid back pain 04/27/2019   Left knee pain 05/12/2017   Atypical chest pain 04/06/2017   Hyperlipidemia 04/06/2017   Right wrist injury 09/29/2015   Calcific tendinitis 07/23/2013   Strain of right Achilles tendon 07/23/2013   Right hip pain 08/15/2012   Chest wall mass  08/15/2012   TROCHANTERIC BURSITIS 04/09/2010   Osteoarthritis of right hip 06/07/2008   OA (osteoarthritis) of knee 06/07/2008    Past Surgical History:  Procedure Laterality Date   JOINT REPLACEMENT     SHOULDER ARTHROSCOPY W/ ROTATOR CUFF REPAIR Left 1992   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   Procedure: RIGHT TOTAL HIP ARTHROPLASTY ANTERIOR APPROACH;  Surgeon: Samson Frederic, MD;  Location: [facility] OR;  Service: Orthopedics;  Laterality: Right;  Needs RNFA       Home Medications    Prior to Admission medications   Medication Sig Start Date End Date Taking? Authorizing Provider  atorvastatin (LIPITOR) 10 MG tablet Take 1 tablet (10 mg total) by mouth daily. 08/01/23   Saguier, Ramon Dredge, PA-C  ciprofloxacin (CIPRO) 500 MG tablet Take 1 tablet (500 mg total) by mouth 2 (two) times daily. Patient not taking: Reported on 11/11/2023 09/02/23   Saguier, Ramon Dredge, PA-C  tacrolimus (PROTOPIC) 0.1 % ointment Apply 1(one) application(s) topically 1-2 times a day to rash under eyes. Patient not taking: Reported on 11/11/2023 06/08/23     tamsulosin (FLOMAX) 0.4 MG CAPS capsule Take 1 capsule once daily 10/28/22       Family History Family History  Problem Relation Age of Onset   Hyperlipidemia Mother    Hypertension Mother    Sudden death Father    Diabetes Father    Hyperlipidemia Father    Hypertension Father  Social History Social History   Tobacco Use   Smoking status: Never   Smokeless tobacco: Never  Vaping Use   Vaping status: Never Used  Substance Use Topics   Alcohol use: Yes    Comment: 09/12/2017 "couple drinks/year"   Drug use: No     Allergies   Tramadol   Review of Systems Review of Systems  Neurological:  Positive for headaches.  Per HPI   Physical Exam Triage Vital Signs ED Triage Vitals  Encounter Vitals Group     BP 11/11/23 0859 139/73     Systolic BP Percentile --      Diastolic BP Percentile --       Pulse Rate 11/11/23 0859 73     Resp 11/11/23 0859 16     Temp 11/11/23 0859 98 F (36.7 C)     Temp Source 11/11/23 0859 Oral     SpO2 11/11/23 0859 97 %     Weight --      Height --      Head Circumference --      Peak Flow --      Pain Score 11/11/23 0856 4     Pain Loc --      Pain Education --      Exclude from Growth Chart --    No data found.  Updated Vital Signs BP 139/73 (BP Location: Right Arm)   Pulse 73   Temp 98 F (36.7 C) (Oral)   Resp 16   SpO2 97%   Visual Acuity Right Eye Distance:   Left Eye Distance:   Bilateral Distance:    Right Eye Near:   Left Eye Near:    Bilateral Near:     Physical Exam Vitals and nursing note reviewed.  Constitutional:      Appearance: He is not ill-appearing or toxic-appearing.  HENT:     Head: Normocephalic and atraumatic.     Right Ear: Hearing, tympanic membrane, ear canal and external ear normal.     Left Ear: Hearing, tympanic membrane, ear canal and external ear normal.     Nose: Nose normal.     Mouth/Throat:     Lips: Pink.     Mouth: Mucous membranes are moist. No injury or oral lesions.     Dentition: Normal dentition.     Tongue: No lesions.     Pharynx: Oropharynx is clear. Uvula midline. No pharyngeal swelling, oropharyngeal exudate, posterior oropharyngeal erythema, uvula swelling or postnasal drip.     Tonsils: No tonsillar exudate.  Eyes:     General: Lids are normal. Vision grossly intact. Gaze aligned appropriately.     Extraocular Movements: Extraocular movements intact.     Conjunctiva/sclera: Conjunctivae normal.  Neck:     Trachea: Trachea and phonation normal.  Cardiovascular:     Rate and Rhythm: Normal rate and regular rhythm.     Heart sounds: Normal heart sounds, S1 normal and S2 normal.  Pulmonary:     Effort: Pulmonary effort is normal. No respiratory distress.     Breath sounds: Normal breath sounds and air entry.  Musculoskeletal:     Cervical back: Neck supple.   Lymphadenopathy:     Cervical: No cervical adenopathy.  Skin:    General: Skin is warm and dry.     Capillary Refill: Capillary refill takes less than 2 seconds.     Findings: No rash.  Neurological:     General: No focal deficit present.     Mental Status:  He is alert and oriented to person, place, and time. Mental status is at baseline.     GCS: GCS eye subscore is 4. GCS verbal subscore is 5. GCS motor subscore is 6.     Cranial Nerves: Cranial nerves 2-12 are intact. No dysarthria or facial asymmetry.     Sensory: Sensation is intact.     Motor: Motor function is intact. No weakness, tremor, abnormal muscle tone or pronator drift.     Coordination: Coordination is intact. Romberg sign negative. Coordination normal. Finger-Nose-Finger Test normal.     Gait: Gait is intact.     Comments: Strength and sensation intact to bilateral upper and lower extremities (5/5). Moves all 4 extremities with normal coordination voluntarily. Non-focal neuro exam.   Psychiatric:        Mood and Affect: Mood normal.        Speech: Speech normal.        Behavior: Behavior normal.        Thought Content: Thought content normal.        Judgment: Judgment normal.      UC Treatments / Results  Labs (all labs ordered are listed, but only abnormal results are displayed) Labs Reviewed - No data to display  EKG   Radiology No results found.  Procedures Procedures (including critical care time)  Medications Ordered in UC Medications  ketorolac (TORADOL) 30 MG/ML injection 30 mg (30 mg Intramuscular Given 11/11/23 0921)  dexamethasone (DECADRON) injection 10 mg (10 mg Intramuscular Given 11/11/23 0922)  metoCLOPramide (REGLAN) injection 5 mg (5 mg Intramuscular Given 11/11/23 5621)    Initial Impression / Assessment and Plan / UC Course  I have reviewed the triage vital signs and the nursing notes.  Pertinent labs & imaging results that were available during my care of the patient were reviewed  by me and considered in my medical decision making (see chart for details).   1. Bad headache Evaluation suggests tension type headache.   Neurologic exam without focal deficit, patient intact to baseline.  Low suspicion for acute intracranial abnormality.   Nursing administered headache cocktail in clinic for headache with some relief prior to discharge, though headache moved from the occiput to the left temple. Headache 2/10 prior to discharge.  Reviewed most recent blood work (CMP from September 2024) prior to administering headache cocktail.  May use OTC medications as needed for further head pain relief.  No NSAIDs until tomorrow due to ketorolac use here.   Encouraged to drink plenty of fluids to stay well hydrated.  Follow-up with PCP as needed for further evaluation of persistent headaches. Neurology referral may be beneficial in future should headaches worsen or become more persistent.  Counseled patient on potential for adverse effects with medications prescribed/recommended today, strict ER and return-to-clinic precautions discussed, patient verbalized understanding.    Final Clinical Impressions(s) / UC Diagnoses   Final diagnoses:  Bad headache     Discharge Instructions      You have been evaluated today for headache.  You were given medicines for your headache in the clinic today which included a strong NSAID, so do not  take ibuprofen or other NSAIDS (Aleve, aspirin, naproxen, ibuprofen, goody powder, etc.) for the next 12 hours.  Starting tomorrow, take 600mg  ibuprofen every 6 hours or tylenol 1,000 every 6 hours as needed for pain.  Avoid areas of loud noise/harsh light and remember to drink plenty of water to stay well hydrated.  Please follow up with your primary care  provider for further management of your headaches.  Please seek emergency medical care if you experience worsening or uncontrolled pain, vision changes, recurrent vomiting, difficulty with  normal activities, abnormal behavior, difficulty walking, numbness, weakness, or any other concerning symptoms.      ED Prescriptions   None    PDMP not reviewed this encounter.   Carlisle Beers, FNP 11/11/23 0943    Carlisle Beers, FNP 11/11/23 0944    Carlisle Beers, FNP 11/11/23 [phone]

## 2023-11-17 ENCOUNTER — Other Ambulatory Visit (HOSPITAL_COMMUNITY): Payer: Self-pay

## 2023-11-22 ENCOUNTER — Other Ambulatory Visit (HOSPITAL_COMMUNITY): Payer: Self-pay

## 2023-12-02 ENCOUNTER — Other Ambulatory Visit (HOSPITAL_COMMUNITY): Payer: Self-pay

## 2023-12-02 ENCOUNTER — Other Ambulatory Visit: Payer: Self-pay

## 2023-12-06 ENCOUNTER — Other Ambulatory Visit (HOSPITAL_COMMUNITY): Payer: Self-pay

## 2023-12-06 MED ORDER — TAMSULOSIN HCL 0.4 MG PO CAPS
0.4000 mg | ORAL_CAPSULE | Freq: Every day | ORAL | 3 refills | Status: AC
  Filled 2023-12-06: qty 30, 30d supply, fill #0
  Filled 2024-01-02: qty 30, 30d supply, fill #1
  Filled 2024-02-21: qty 30, 30d supply, fill #2
  Filled 2024-04-03: qty 30, 30d supply, fill #3
  Filled 2024-05-02: qty 30, 30d supply, fill #4
  Filled 2024-06-13: qty 30, 30d supply, fill #5
  Filled 2024-07-13: qty 30, 30d supply, fill #6
  Filled 2024-08-10: qty 30, 30d supply, fill #7
  Filled 2024-10-22: qty 30, 30d supply, fill #8

## 2024-01-24 ENCOUNTER — Encounter: Payer: Self-pay | Admitting: Sports Medicine

## 2024-01-24 ENCOUNTER — Ambulatory Visit: Admitting: Sports Medicine

## 2024-01-24 VITALS — BP 110/72 | Ht 70.0 in | Wt 190.0 lb

## 2024-01-24 DIAGNOSIS — G8929 Other chronic pain: Secondary | ICD-10-CM | POA: Diagnosis not present

## 2024-01-24 DIAGNOSIS — M25511 Pain in right shoulder: Secondary | ICD-10-CM | POA: Diagnosis not present

## 2024-01-24 DIAGNOSIS — M19011 Primary osteoarthritis, right shoulder: Secondary | ICD-10-CM

## 2024-01-24 NOTE — Progress Notes (Signed)
   Subjective:    Patient ID: [facility], male    DOB: 10-01-1966, 58 y.o.   MRN: [id number]  HPI chief complaint: Right shoulder pain  Patient is a very pleasant right-hand-dominant 58 year old male that presents today with acute right shoulder pain.  While playing basketball last week he felt a sudden sharp pain along the lateral shoulder with some associated numbness.  Since then, he has noticed an inability to actively raise the arm overhead.  He has also noticed some weakness in this arm.  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  He has received both subacromial and glenohumeral injections in the past.  No prior right shoulder surgery.  He does have a history of left shoulder arthroscopy for impingement.  Past medical history reviewed Medications reviewed Allergies reviewed   Review of Systems As above    Objective:   Physical Exam  Well-developed, well-nourished.  No acute distress  Right shoulder: No obvious atrophy.  No tenderness to palpation.  Active forward flexion and abduction is limited to about 80 degrees.  Passive abduction is to about 130 degrees.  4/5 strength with resisted supraspinatus and external rotation on the right.  Neurovascularly intact distally.      Assessment & Plan:   Right shoulder pain secondary to rotator cuff tear  MRI of the right shoulder to evaluate degree of rotator cuff tearing present.  I will MyChart message him with those results when available and we will delineate further treatment.  In the meantime, I encouraged him to continue with active shoulder range of motion to help prevent adhesive capsulitis.

## 2024-01-28 ENCOUNTER — Other Ambulatory Visit (HOSPITAL_COMMUNITY): Payer: Self-pay

## 2024-01-31 ENCOUNTER — Encounter: Payer: Self-pay | Admitting: Sports Medicine

## 2024-01-31 ENCOUNTER — Ambulatory Visit
Admission: RE | Admit: 2024-01-31 | Discharge: 2024-01-31 | Disposition: A | Discharge status: Home / Self Care | Source: Ambulatory Visit | Attending: Sports Medicine | Admitting: Sports Medicine

## 2024-01-31 DIAGNOSIS — M19011 Primary osteoarthritis, right shoulder: Secondary | ICD-10-CM

## 2024-01-31 DIAGNOSIS — G8929 Other chronic pain: Secondary | ICD-10-CM

## 2024-02-01 ENCOUNTER — Encounter: Payer: Self-pay | Admitting: Sports Medicine

## 2024-06-27 ENCOUNTER — Ambulatory Visit: Admitting: Sports Medicine

## 2024-06-27 ENCOUNTER — Encounter: Payer: Self-pay | Admitting: Sports Medicine

## 2024-06-27 VITALS — BP 118/86 | Ht 70.0 in | Wt 190.0 lb

## 2024-06-27 DIAGNOSIS — M1712 Unilateral primary osteoarthritis, left knee: Secondary | ICD-10-CM

## 2024-06-27 MED ORDER — METHYLPREDNISOLONE ACETATE 40 MG/ML IJ SUSP
40.0000 mg | Freq: Once | INTRAMUSCULAR | Status: AC
  Administered 2024-06-27: 40 mg via INTRA_ARTICULAR

## 2024-06-27 NOTE — Progress Notes (Signed)
   Subjective:    Patient ID: [facility], male    DOB: 1966-04-16, 58 y.o.   MRN: [id number]  HPI chief complaint: Left knee pain  Sha presents today with chief complaint of left knee pain has been present for almost a week.  No known trauma.  Pain has been affecting his quality of life.  He has found it difficult to sleep.  Pain is along the medial aspect of the knee.  He has a history of documented right knee DJD which has responded well to cortisone injections in the past.  Left knee pain is similar to what he is experienced in the past in the right knee.  He has been using Biofreeze, ibuprofen, and ice but despite this his pain persists.    Review of Systems As above    Objective:   Physical Exam  Well-developed, well-nourished.  No acute distress  Left knee: Full range of motion.  No effusion.  Mild varus malalignment.  He is tender to palpation along the medial joint line.  Negative McMurray's.  No tenderness along the lateral joint line.  Knee is stable to ligamentous exam.  Limited MSK ultrasound of the left medial knee joint space shows some spurring consistent with OA      Assessment & Plan:   Left knee pain secondary to medial compartmental DJD  Patient's left knee is injected today with cortisone.  This is accomplished atraumatically under sterile technique after risks and benefits were explained.  An anterior lateral approach was utilized.  If symptoms persist, consider x-rays.  Follow-up for ongoing or recalcitrant issues.  Consent obtained and verified. Time-out conducted. Noted no overlying erythema, induration, or other signs of local infection. Skin prepped in a sterile fashion. Topical analgesic spray: Ethyl chloride. Joint: Left knee Needle: 25-gauge 1.5 inch Completed without difficulty. Meds: 3 cc 1% Xylocaine, 1 cc (40 mg) Depo-Medrol   This note was dictated using Dragon naturally speaking software and may contain errors in syntax, spelling, or  content which have not been identified prior to signing this note.

## 2024-07-26 ENCOUNTER — Telehealth: Admitting: Medical

## 2024-07-26 DIAGNOSIS — M170 Bilateral primary osteoarthritis of knee: Secondary | ICD-10-CM | POA: Diagnosis not present

## 2024-07-26 DIAGNOSIS — G8929 Other chronic pain: Secondary | ICD-10-CM

## 2024-07-26 NOTE — Patient Instructions (Addendum)
 Knee pain(osteoarthritis) -placed referral to radiation oncologist at pt request for potential low dose radiation oncologist.  -check my chart for information on referral. Contact them as follow up to referral. -continue low dose ibuprofen for pain. -if issues getting schedule please let me know. -Sent bp update for today visit by my chart.  Follow up date to be determined depnending on level of knee pain and if referral to specialist delayed.

## 2024-07-26 NOTE — Progress Notes (Signed)
 Virtual Visit via Video Note  I connected with [facility] on 07/26/24 at  8:40 AM EDT by a video enabled telemedicine application and verified that I am speaking with the correct person using two identifiers.  Location: Patient: car park in [place], [place] Provider: office [place]   I discussed the limitations of evaluation and management by telemedicine and the availability of in person appointments. The patient expressed understanding and agreed to proceed.  History of Present Illness: Discussed the use of AI scribe software for clinical note transcription with the patient, who gave verbal consent to proceed.  History of Present Illness         Pt complains of  joint pain various locations but worse in his knees. Has seen sports med. Knee pain constant despite cortisone injection tx with sport med. Pt states at hospital new treatments for OA. Wife who is RN aware of these new treatments. He states new treatment low dose radiation described focused to knees  Pt states recently cortisone injection to his knee recently did not help much at all. He want to avoid knee replacements. He uses low dose ibuprofen to help with pain.   Pt states oncologist  is doing treatments. Pt is convinced it is oncologist although I questioned if thought maybe interventional radiologist?  Pt Estefana Cha. MD Radiation oncologist. [phone]).    Observations/Objective:  General-no acute distress, pleasant, oriented. Lungs- on inspection lungs appear unlabored. Neck- no tracheal deviation or jvd on inspection. Neuro- gross motor function appears intact.  Assessment and Plan: Assessment and Plan          Patient Instructions  Knee pain(osteoarthritis) -placed referral to radiation oncologist at pt request for potential low dose radiation oncologist.  -check my chart for information on referral. Contact them as follow up to referral. -continue low dose ibuprofen for pain. -if issues getting  schedule please let me know. -Sent bp update for today visit by my chart.  Follow up date to be determined depnending on level of knee pain and if referral to specialist delayed.    Dallas Maxwell, PA-C    Follow Up Instructions:    I discussed the assessment and treatment plan with the patient. The patient was provided an opportunity to ask questions and all were answered. The patient agreed with the plan and demonstrated an understanding of the instructions.   The patient was advised to call back or seek an in-person evaluation if the symptoms worsen or if the condition fails to improve as anticipated.   Deondria Puryear, PA-C

## 2024-08-06 NOTE — Progress Notes (Signed)
 Radiation Oncology         [phone] ________________________________  Name: Bruce Hart        MRN: [id number]  Date of Service: 08/08/2024 DOB: 06/05/1966  RR:Djhlpzm, [place] Bruce Arvell Evalene JONELLE, DO     REFERRING PHYSICIAN: Arvell Evalene JONELLE, DO  DIAGNOSIS: OA (osteoarthritis) of knee M17.9   HISTORY OF PRESENT ILLNESS: [name] is a 58 y.o. male seen in consultation for radiation therapy.  He has a lengthy history of osteoarthritis affecting various joints including hips, knees and shoulders. He has chronic symptoms with intermittent flares typically treated with intra-articular CSI. He is status post R total hip arthroplasty in 2018.   He is very active and plays basketball regularly. He has occasional arthritis flares which are often preempted by physical activity. He self treats with ibuprofen with incomplete relief. He has undergone numerous CSIs to various joints with mixed relief.  Most recent CSI to L knee was 06/27/24. US  at that time demonstrated spurring consistent with osteoarthritis. He had no relief from this injection. Previous CSI to L knee was 02/18/23. US  obtained at that time and demonstrated moderate effusion, calcific change within the suprapatellar pouch, severe joint space narrowing in the medial compartment with hyperemia and synovitis laterally consistent with severe osteoarthritis.   Most recent CSIs to R shoulder were 08/31/23 and again on 09/28/23 due to limited relief. X-ray of R shoulder obtained at 08/31/23 visit and interpreted by sports medicine physician who noted inferior glenoid spurring, decreased joint space and distal acromion cortical irregularities.   Most recent CSI to R knee was 08/31/23. This injection was successful in alleviating his pain. X-ray of R knee obtained at 08/31/23 visit and interpreted by sports medicine physician who noted severe degenerative changes primarily in medial and patellofemoral compartment with moderate  degenerative changes in lateral compartment. Previous CSI to R knee was 12/04/21 and US  at that time showed seere joint space changes medially.  Currently his knees are the most symptomatic location. He wants to avoid knee replacement. He uses ibuprofen which helps some with the pain. Recent cortisone injection to L knee on 06/27/24 was not helpful.     PREVIOUS RADIATION THERAPY: {EXAM; YES/NO:19492::No}  AUTOIMMUNE DISEASE: {EXAM; YES/NO:19492::No}  MEDICAL DEVICES: {EXAM; YES/NO:19492::No}  PREGNANCY: {EXAM; YES/NO:19492::No}   PAST MEDICAL HISTORY:  Past Medical History:  Diagnosis Date   Anemia    as teenager   Arthritis    all over (09/12/2017)   Arthritis, multiple joint involvement    Hepatitis 1969   Hep B?    History of kidney stones    PONV (postoperative nausea and vomiting)        PAST SURGICAL HISTORY: Past Surgical History:  Procedure Laterality Date   JOINT REPLACEMENT     SHOULDER ARTHROSCOPY W/ ROTATOR CUFF REPAIR Left 1992   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   Procedure: RIGHT TOTAL HIP ARTHROPLASTY ANTERIOR APPROACH;  Surgeon: Bruce Rogue, MD;  Location: [facility] OR;  Service: Orthopedics;  Laterality: Right;  Needs RNFA     FAMILY HISTORY:  Family History  Problem Relation Age of Onset   Hyperlipidemia Mother    Hypertension Mother    Sudden death Father    Diabetes Father    Hyperlipidemia Father    Hypertension Father      SOCIAL HISTORY:  reports that he has never smoked. He has never used smokeless tobacco. He reports current alcohol use. He reports  that he does not use drugs.   ALLERGIES: Tramadol   MEDICATIONS:  Current Outpatient Medications  Medication Sig Dispense Refill   atorvastatin  (LIPITOR) 10 MG tablet Take 1 tablet (10 mg total) by mouth daily. 90 tablet 3   ciprofloxacin  (CIPRO ) 500 MG tablet Take 1 tablet (500 mg total) by mouth 2 (two) times daily. (Patient not  taking: Reported on 07/26/2024) 20 tablet 0   tacrolimus  (PROTOPIC ) 0.1 % ointment Apply 1(one) application(s) topically 1-2 times a day to rash under eyes. (Patient not taking: Reported on 07/26/2024) 30 g 1   tamsulosin  (FLOMAX ) 0.4 MG CAPS capsule Take 1 capsule once daily 90 capsule 3   tamsulosin  (FLOMAX ) 0.4 MG CAPS capsule Take 1 capsule (0.4 mg total) by mouth daily. 90 capsule 3   No current facility-administered medications for this visit.     REVIEW OF SYSTEMS: The patient reports that s***/he is doing well overall and a review of symptoms is otherwise negative.      PHYSICAL EXAM:  Wt Readings from Last 3 Encounters:  06/27/24 190 lb (86.2 kg)  01/24/24 190 lb (86.2 kg)  09/28/23 190 lb (86.2 kg)   Temp Readings from Last 3 Encounters:  11/11/23 98 F (36.7 C) (Oral)  09/02/23 98.2 F (36.8 C)  06/16/23 98.6 F (37 C) (Oral)   BP Readings from Last 3 Encounters:  06/27/24 118/86  01/24/24 110/72  11/11/23 139/73   Pulse Readings from Last 3 Encounters:  11/11/23 73  09/28/23 76  09/02/23 60    /10   Physical Exam   ECOG = ***   LABORATORY DATA:  Lab Results  Component Value Date   WBC 5.0 09/02/2023   HGB 14.3 09/02/2023   HCT 43.8 09/02/2023   MCV 90.5 09/02/2023   PLT 236.0 09/02/2023   Lab Results  Component Value Date   NA 136 06/16/2023   K 4.8 06/16/2023   CL 102 06/16/2023   CO2 30 06/16/2023   Lab Results  Component Value Date   ALT 25 06/16/2023   AST 26 06/16/2023   ALKPHOS 87 06/16/2023   BILITOT 2.0 (H) 06/16/2023      RADIOGRAPHY:   06/27/24 Limited L medial knee US : joint space shows some spurring consistent with OA    08/31/23 R Knee x-ray: Severe 3 compartmental osteoarthritis greatest in the medial and patellofemoral compartments.   08/31/23 R shoulder x-ray: Progressive right shoulder osteoarthritis. Inferior glenoid spurring, decreased joint space, distal acromion cortical irregularities    02/18/23  Limited R knee US : Moderate effusion. Calcific change appreciated within the suprapatellar pouch. Normal-appearing quadricep patellar tendon. Severe joint space narrowing in the medial compartment with hyperemia. Synovitis appreciated laterally   Summary: Findings consistent with severe osteoarthritis   Ultrasound and interpretation by Venetia Evans, MD   12/04/21 Limited R knee US : Moderate effusion. Normal-appearing quadricep and patellar tendon. Severe joint space changes medially with degenerative changes of the meniscus. Moderate changes of the meniscus laterally.    10/19/2021 L Kne X-ray:  There is no acute fracture or dislocation. There is severe osteoarthritic changes with tricompartmental narrowing and severe spurring. There is associated mild varus angulation. No significant joint effusion. The soft tissues are unremarkable.   IMPRESSION: 1. No acute fracture or dislocation. 2. Severe osteoarthritis   04/26/2019 T spine x-ray: Thoracic osteophytosis without disc space narrowing, fracture or other focal lesion.   04/26/19 L spine x-ray: Mild multilevel osteoarthritic changes at all levels of the lumbosacral spine   02/20/2009  MRI R knee: Tricompartmental degenerative changes.   PATHOLOGY: none     IMPRESSION/PLAN:  Mr. Cortopassi is a 58 yo M w/ a lengthy hx of osteoarthritis affecting multiple large joints, predominantly shoulder, hip and knee with bilateral knees being the most symptomatic location currently. He is s/p R hip replacement in 2018 and he does not wish to undergo further surgery. CSI and NSAIDs provide minimal relief.  After a detailed discussion of the patient's history, prior treatment response, and current goals, we reviewed the proposed protocol for LDRT targeting the bilateral knees. Our institutional approach follows a regimen of 3 Gy total, delivered in 6 fractions of 0.5 Gy on a Monday-Wednesday-Friday schedule over two weeks. This dosing is  consistent with published guidelines and peer-reviewed data for non-malignant musculoskeletal conditions.  We reviewed:   Goals of care: symptom relief and improved mobility Expected timeline for therapeutic benefit: typically several weeks to months Potential risks, including rare but theoretical concerns regarding late tissue effects or secondary malignancy (risk estimated to be extremely low at this dose and age) Lack of immunosuppression and inactive autoimmune history, minimizing concern for immune-mediated complications   My goal with low dose radiation therapy is to help reduce chronic joint pain and improve her mobility, particularly for activities like basketball and other sports. This treatment does not reverse joint damage, but it can calm the inflammation in and around the joint that contributes to pain. Based on published data, about 60-80% of patients with osteoarthritis experience meaningful pain relief within a few weeks to months after completing a short course of low-dose radiation. While not everyone responds, the majority of patients do report improvement, and the treatment is generally well tolerated.  The patient was agreeable to proceed. We will arrange CT simulation for planning and initiate treatment pending final review and setup. A follow-up visit will be scheduled approximately 8-12 weeks after completion of LDRT to assess clinical response.   We personally spent 60 minutes in this encounter including chart review, reviewing radiological studies, meeting face-to-face with the patient, entering orders and completing documentation.    Estefana HERO. Maritza, M.D.

## 2024-08-07 NOTE — Progress Notes (Incomplete)
 Location of Primary:  [facility]. Vinas presents as a referral from   Pain on a scale of 0-10  Ambulatory status? Walker? Wheelchair? :Bruce Hart is independent.     Safety Issues: Prior radiation: Pacemaker/ICD: Is the patient on methtrexate:  Current complaints/other details:

## 2024-08-08 ENCOUNTER — Ambulatory Visit
Admission: RE | Admit: 2024-08-08 | Discharge: 2024-08-08 | Disposition: A | Discharge status: Home / Self Care | Source: Ambulatory Visit | Attending: Radiation Oncology | Admitting: Radiation Oncology

## 2024-08-08 ENCOUNTER — Encounter: Payer: Self-pay | Admitting: Radiation Oncology

## 2024-08-08 VITALS — BP 130/74 | HR 69 | Temp 97.9°F | Resp 18 | Ht 70.0 in | Wt 210.6 lb

## 2024-08-08 DIAGNOSIS — Z87442 Personal history of urinary calculi: Secondary | ICD-10-CM | POA: Diagnosis not present

## 2024-08-08 DIAGNOSIS — M1712 Unilateral primary osteoarthritis, left knee: Secondary | ICD-10-CM | POA: Diagnosis not present

## 2024-08-08 DIAGNOSIS — M1711 Unilateral primary osteoarthritis, right knee: Secondary | ICD-10-CM | POA: Insufficient documentation

## 2024-08-08 DIAGNOSIS — M19011 Primary osteoarthritis, right shoulder: Secondary | ICD-10-CM | POA: Insufficient documentation

## 2024-08-08 DIAGNOSIS — Z79899 Other long term (current) drug therapy: Secondary | ICD-10-CM | POA: Diagnosis not present

## 2024-08-08 DIAGNOSIS — Z79621 Long term (current) use of calcineurin inhibitor: Secondary | ICD-10-CM | POA: Insufficient documentation

## 2024-08-10 ENCOUNTER — Other Ambulatory Visit: Payer: Self-pay | Admitting: Medical

## 2024-08-10 ENCOUNTER — Other Ambulatory Visit: Payer: Self-pay

## 2024-08-10 ENCOUNTER — Other Ambulatory Visit (HOSPITAL_COMMUNITY): Payer: Self-pay

## 2024-08-10 MED ORDER — ATORVASTATIN CALCIUM 10 MG PO TABS
10.0000 mg | ORAL_TABLET | Freq: Every day | ORAL | 3 refills | Status: AC
  Filled 2024-08-10: qty 30, 30d supply, fill #0
  Filled 2024-09-12: qty 30, 30d supply, fill #1
  Filled 2024-10-22: qty 30, 30d supply, fill #2

## 2024-08-15 ENCOUNTER — Ambulatory Visit
Admission: RE | Admit: 2024-08-15 | Discharge: 2024-08-15 | Disposition: A | Discharge status: Home / Self Care | Source: Ambulatory Visit | Attending: Radiation Oncology | Admitting: Radiation Oncology

## 2024-08-15 DIAGNOSIS — M1712 Unilateral primary osteoarthritis, left knee: Secondary | ICD-10-CM | POA: Diagnosis present

## 2024-08-15 DIAGNOSIS — Z510 Encounter for antineoplastic radiation therapy: Secondary | ICD-10-CM | POA: Diagnosis present

## 2024-08-21 DIAGNOSIS — Z510 Encounter for antineoplastic radiation therapy: Secondary | ICD-10-CM | POA: Diagnosis not present

## 2024-08-22 ENCOUNTER — Ambulatory Visit
Admission: RE | Admit: 2024-08-22 | Discharge: 2024-08-22 | Disposition: A | Discharge status: Home / Self Care | Source: Ambulatory Visit | Attending: Radiation Oncology | Admitting: Radiation Oncology

## 2024-08-22 ENCOUNTER — Other Ambulatory Visit: Payer: Self-pay

## 2024-08-22 DIAGNOSIS — Z510 Encounter for antineoplastic radiation therapy: Secondary | ICD-10-CM | POA: Diagnosis not present

## 2024-08-22 LAB — RAD ONC ARIA SESSION SUMMARY
Course Elapsed Days: 0
Plan Fractions Treated to Date: 1
Plan Fractions Treated to Date: 1
Plan Prescribed Dose Per Fraction: 0.5 Gy
Plan Prescribed Dose Per Fraction: 0.5 Gy
Plan Total Fractions Prescribed: 6
Plan Total Fractions Prescribed: 6
Plan Total Prescribed Dose: 3 Gy
Plan Total Prescribed Dose: 3 Gy
Reference Point Dosage Given to Date: 0.5 Gy
Reference Point Dosage Given to Date: 0.5 Gy
Reference Point Session Dosage Given: 0.5 Gy
Reference Point Session Dosage Given: 0.5 Gy
Session Number: 1

## 2024-08-23 ENCOUNTER — Ambulatory Visit

## 2024-08-24 ENCOUNTER — Ambulatory Visit
Admission: RE | Admit: 2024-08-24 | Discharge: 2024-08-24 | Disposition: A | Discharge status: Home / Self Care | Source: Ambulatory Visit | Attending: Radiation Oncology | Admitting: Radiation Oncology

## 2024-08-24 ENCOUNTER — Other Ambulatory Visit: Payer: Self-pay

## 2024-08-24 DIAGNOSIS — Z510 Encounter for antineoplastic radiation therapy: Secondary | ICD-10-CM | POA: Diagnosis not present

## 2024-08-24 LAB — RAD ONC ARIA SESSION SUMMARY
Course Elapsed Days: 2
Plan Fractions Treated to Date: 2
Plan Fractions Treated to Date: 2
Plan Prescribed Dose Per Fraction: 0.5 Gy
Plan Prescribed Dose Per Fraction: 0.5 Gy
Plan Total Fractions Prescribed: 6
Plan Total Fractions Prescribed: 6
Plan Total Prescribed Dose: 3 Gy
Plan Total Prescribed Dose: 3 Gy
Reference Point Dosage Given to Date: 1 Gy
Reference Point Dosage Given to Date: 1 Gy
Reference Point Session Dosage Given: 0.5 Gy
Reference Point Session Dosage Given: 0.5 Gy
Session Number: 2

## 2024-08-27 ENCOUNTER — Ambulatory Visit
Admission: RE | Admit: 2024-08-27 | Discharge: 2024-08-27 | Disposition: A | Source: Ambulatory Visit | Attending: Radiation Oncology

## 2024-08-27 ENCOUNTER — Other Ambulatory Visit: Payer: Self-pay

## 2024-08-27 DIAGNOSIS — Z510 Encounter for antineoplastic radiation therapy: Secondary | ICD-10-CM | POA: Diagnosis not present

## 2024-08-27 LAB — RAD ONC ARIA SESSION SUMMARY
Course Elapsed Days: 5
Plan Fractions Treated to Date: 3
Plan Fractions Treated to Date: 3
Plan Prescribed Dose Per Fraction: 0.5 Gy
Plan Prescribed Dose Per Fraction: 0.5 Gy
Plan Total Fractions Prescribed: 6
Plan Total Fractions Prescribed: 6
Plan Total Prescribed Dose: 3 Gy
Plan Total Prescribed Dose: 3 Gy
Reference Point Dosage Given to Date: 1.5 Gy
Reference Point Dosage Given to Date: 1.5 Gy
Reference Point Session Dosage Given: 0.5 Gy
Reference Point Session Dosage Given: 0.5 Gy
Session Number: 3

## 2024-08-28 ENCOUNTER — Ambulatory Visit

## 2024-08-29 ENCOUNTER — Ambulatory Visit

## 2024-08-29 ENCOUNTER — Ambulatory Visit
Admission: RE | Admit: 2024-08-29 | Discharge: 2024-08-29 | Disposition: A | Discharge status: Home / Self Care | Source: Ambulatory Visit | Attending: Radiation Oncology | Admitting: Radiation Oncology

## 2024-08-29 ENCOUNTER — Other Ambulatory Visit: Payer: Self-pay

## 2024-08-29 DIAGNOSIS — Z510 Encounter for antineoplastic radiation therapy: Secondary | ICD-10-CM | POA: Diagnosis not present

## 2024-08-29 LAB — RAD ONC ARIA SESSION SUMMARY
Course Elapsed Days: 7
Plan Fractions Treated to Date: 4
Plan Fractions Treated to Date: 4
Plan Prescribed Dose Per Fraction: 0.5 Gy
Plan Prescribed Dose Per Fraction: 0.5 Gy
Plan Total Fractions Prescribed: 6
Plan Total Fractions Prescribed: 6
Plan Total Prescribed Dose: 3 Gy
Plan Total Prescribed Dose: 3 Gy
Reference Point Dosage Given to Date: 2 Gy
Reference Point Dosage Given to Date: 2 Gy
Reference Point Session Dosage Given: 0.5 Gy
Reference Point Session Dosage Given: 0.5 Gy
Session Number: 4

## 2024-08-31 ENCOUNTER — Ambulatory Visit
Admission: RE | Admit: 2024-08-31 | Discharge: 2024-08-31 | Disposition: A | Discharge status: Home / Self Care | Source: Ambulatory Visit | Attending: Radiation Oncology | Admitting: Radiation Oncology

## 2024-08-31 ENCOUNTER — Other Ambulatory Visit: Payer: Self-pay

## 2024-08-31 DIAGNOSIS — Z510 Encounter for antineoplastic radiation therapy: Secondary | ICD-10-CM | POA: Diagnosis not present

## 2024-08-31 LAB — RAD ONC ARIA SESSION SUMMARY
Course Elapsed Days: 9
Plan Fractions Treated to Date: 5
Plan Fractions Treated to Date: 5
Plan Prescribed Dose Per Fraction: 0.5 Gy
Plan Prescribed Dose Per Fraction: 0.5 Gy
Plan Total Fractions Prescribed: 6
Plan Total Fractions Prescribed: 6
Plan Total Prescribed Dose: 3 Gy
Plan Total Prescribed Dose: 3 Gy
Reference Point Dosage Given to Date: 2.5 Gy
Reference Point Dosage Given to Date: 2.5 Gy
Reference Point Session Dosage Given: 0.5 Gy
Reference Point Session Dosage Given: 0.5 Gy
Session Number: 5

## 2024-09-03 ENCOUNTER — Other Ambulatory Visit: Payer: Self-pay

## 2024-09-03 ENCOUNTER — Ambulatory Visit
Admission: RE | Admit: 2024-09-03 | Discharge: 2024-09-03 | Disposition: A | Discharge status: Home / Self Care | Source: Ambulatory Visit | Attending: Radiation Oncology | Admitting: Radiation Oncology

## 2024-09-03 DIAGNOSIS — Z510 Encounter for antineoplastic radiation therapy: Secondary | ICD-10-CM | POA: Diagnosis not present

## 2024-09-03 LAB — RAD ONC ARIA SESSION SUMMARY
Course Elapsed Days: 12
Plan Fractions Treated to Date: 6
Plan Fractions Treated to Date: 6
Plan Prescribed Dose Per Fraction: 0.5 Gy
Plan Prescribed Dose Per Fraction: 0.5 Gy
Plan Total Fractions Prescribed: 6
Plan Total Fractions Prescribed: 6
Plan Total Prescribed Dose: 3 Gy
Plan Total Prescribed Dose: 3 Gy
Reference Point Dosage Given to Date: 3 Gy
Reference Point Dosage Given to Date: 3 Gy
Reference Point Session Dosage Given: 0.5 Gy
Reference Point Session Dosage Given: 0.5 Gy
Session Number: 6

## 2024-09-04 NOTE — Radiation Completion Notes (Signed)
 Patient Name: Bruce Hart, Bruce Hart MRN: [id number] Date of Birth: 1966-06-16 Referring Physician: EVALENE CHANG, M.D. Date of Service: 2024-09-04 Radiation Oncologist: Estefana Cha, M.D. [facility]                             RADIATION ONCOLOGY END OF TREATMENT NOTE     Diagnosis: M17.12 Unilateral primary osteoarthritis, left knee; M17.11 Unilateral primary osteoarthritis, right knee Intent: Curative     ==========DELIVERED PLANS==========  First Treatment Date: 2024-08-22 Last Treatment Date: 2024-09-03   Plan Name: Ext_L_Knee Site: Knee, Left Technique: 3D Mode: Photon Dose Per Fraction: 0.5 Gy Prescribed Dose (Delivered / Prescribed): 3 Gy / 3 Gy Prescribed Fxs (Delivered / Prescribed): 6 / 6   Plan Name: Ext_R_Knee Site: Knee, Right Technique: 3D Mode: Photon Dose Per Fraction: 0.5 Gy Prescribed Dose (Delivered / Prescribed): 3 Gy / 3 Gy Prescribed Fxs (Delivered / Prescribed): 6 / 6     ==========ON TREATMENT VISIT DATES========== 2024-08-29     ==========UPCOMING VISITS========== 09/28/2024 [facility]-RADIATION ONC FOLLOW UP 20 Cha Estefana, MD        ==========APPENDIX - ON TREATMENT VISIT NOTES==========   See weekly On Treatment Notes in Epic for details in the Media tab (listed as Progress notes on the On Treatment Visit Dates listed above).

## 2024-09-12 ENCOUNTER — Other Ambulatory Visit (HOSPITAL_COMMUNITY): Payer: Self-pay

## 2024-09-12 ENCOUNTER — Other Ambulatory Visit: Payer: Self-pay

## 2024-09-12 MED ORDER — TAMSULOSIN HCL 0.4 MG PO CAPS
0.4000 mg | ORAL_CAPSULE | Freq: Every day | ORAL | 3 refills | Status: AC
  Filled 2024-09-12: qty 30, 30d supply, fill #0

## 2024-09-15 ENCOUNTER — Other Ambulatory Visit (HOSPITAL_COMMUNITY): Payer: Self-pay

## 2024-09-28 ENCOUNTER — Encounter: Payer: Self-pay | Admitting: Radiation Oncology

## 2024-09-28 ENCOUNTER — Ambulatory Visit
Admission: RE | Admit: 2024-09-28 | Discharge: 2024-09-28 | Disposition: A | Discharge status: Home / Self Care | Source: Ambulatory Visit | Attending: Radiation Oncology | Admitting: Radiation Oncology

## 2024-09-28 VITALS — BP 124/77 | HR 68 | Temp 97.1°F | Resp 18 | Ht 70.0 in | Wt 203.5 lb

## 2024-09-28 DIAGNOSIS — M1712 Unilateral primary osteoarthritis, left knee: Secondary | ICD-10-CM

## 2024-09-28 DIAGNOSIS — M1711 Unilateral primary osteoarthritis, right knee: Secondary | ICD-10-CM

## 2024-09-28 NOTE — Progress Notes (Signed)
"  °  Radiation Oncology         [phone] ________________________________  Name: Bruce Hart MRN: [id number]  Date: 09/28/2024  DOB: [birth year]  Follow-Up Visit Note   CC: Saguier, Edward, PA-C  Saguier, [place], PA-C  Diagnosis:     Osteoarthritis of R knee, M17.11  Osteoarthritis of L knee, M17.12   Interval Since Last Radiation:  3.5 weeks  Narrative:  The patient returns today for routine follow-up.  Doing well overall. Did have tiredness after his second treatment but otherwise was asymptomatic from the radiation. Denies additional fatigue. Denies any skin changes. Does report improvement in pain and mobility. Today he is actually totally pain free. Not using ibuprofen has often. Some days he is still sore after basketball.                              ALLERGIES:  is allergic to tramadol.  Meds: Current Outpatient Medications  Medication Sig Dispense Refill   atorvastatin  (LIPITOR) 10 MG tablet Take 1 tablet (10 mg total) by mouth daily. 90 tablet 3   ciprofloxacin  (CIPRO ) 500 MG tablet Take 1 tablet (500 mg total) by mouth 2 (two) times daily. (Patient not taking: Reported on 07/26/2024) 20 tablet 0   tacrolimus  (PROTOPIC ) 0.1 % ointment Apply 1(one) application(s) topically 1-2 times a day to rash under eyes. (Patient not taking: Reported on 07/26/2024) 30 g 1   tamsulosin  (FLOMAX ) 0.4 MG CAPS capsule Take 1 capsule once daily (Patient not taking: Reported on 08/08/2024) 90 capsule 3   tamsulosin  (FLOMAX ) 0.4 MG CAPS capsule Take 1 capsule (0.4 mg total) by mouth daily. 90 capsule 3   tamsulosin  (FLOMAX ) 0.4 MG CAPS capsule Take 1 capsule (0.4 mg total) by mouth daily. 90 capsule 3   No current facility-administered medications for this encounter.    Physical Findings: The patient is in no acute distress. Patient is alert and oriented.  height is 5' 10 (1.778 m) and weight is 203 lb 8 oz (92.3 kg). His temporal temperature is 97.1 F (36.2 C) (abnormal). His blood  pressure is 124/77 and his pulse is 68. His respiration is 18 and oxygen saturation is 99%. .  No significant changes.  Lab Findings: Lab Results  Component Value Date   WBC 5.0 09/02/2023   HGB 14.3 09/02/2023   HCT 43.8 09/02/2023   MCV 90.5 09/02/2023   PLT 236.0 09/02/2023    Radiographic Findings: No results found.  Impression:  The patient is doing well since completion of radiation. Pain and mobility are improving.  Plan:  Fu at 12 weeks post completion of RT  Total time spent today in preparation for this visit was 30 minutes. This included patient care, documentation, and coordination of care and follow up.    Estefana CHRISTELLA. Maritza, M.D.    "

## 2024-09-28 NOTE — Progress Notes (Signed)
"  ° °  Mr. Bruce Hart presents today via telephone to speak with Dr. Estefana Cha for a routine follow up. He completed radiation treatment on 09-03-2024.      Patient identity verified x2.     Location-       Does the patient complain of any of the following: Post radiation skin issues: Denies Joint Pain/ Swelling: Occasionally Range of Motion limitations: Denies Fatigue post radiation: One time after the second treatment Appetite good/fair/poor: Good  Additional comments if applicable:   BP 124/77 (BP Location: Left Arm, Patient Position: Sitting)   Pulse 68   Temp (!) 97.1 F (36.2 C) (Temporal)   Resp 18   Ht 5' 10 (1.778 m)   Wt 203 lb 8 oz (92.3 kg)   SpO2 99%   BMI 29.20 kg/m   "

## 2024-11-26 ENCOUNTER — Ambulatory Visit: Admitting: Radiation Oncology
